# Patient Record
Sex: Female | Born: 1960 | Race: White | Hispanic: No | Marital: Married | Smoking: Current every day smoker
Health system: Southern US, Community
[De-identification: ages and names within clinical notes are randomized; demographics above are authoritative.]

## PROBLEM LIST (undated history)

## (undated) DIAGNOSIS — E785 Hyperlipidemia, unspecified: Secondary | ICD-10-CM

## (undated) DIAGNOSIS — R002 Palpitations: Secondary | ICD-10-CM

## (undated) DIAGNOSIS — I10 Essential (primary) hypertension: Secondary | ICD-10-CM

## (undated) DIAGNOSIS — I340 Nonrheumatic mitral (valve) insufficiency: Secondary | ICD-10-CM

## (undated) DIAGNOSIS — A6000 Herpesviral infection of urogenital system, unspecified: Secondary | ICD-10-CM

## (undated) DIAGNOSIS — Z8659 Personal history of other mental and behavioral disorders: Secondary | ICD-10-CM

## (undated) HISTORY — DX: Personal history of other mental and behavioral disorders: Z86.59

## (undated) HISTORY — DX: Hyperlipidemia, unspecified: E78.5

## (undated) HISTORY — DX: Essential (primary) hypertension: I10

## (undated) HISTORY — DX: Palpitations: R00.2

## (undated) HISTORY — PX: NO PAST SURGERIES: SHX2092

## (undated) HISTORY — DX: Nonrheumatic mitral (valve) insufficiency: I34.0

## (undated) HISTORY — DX: Herpesviral infection of urogenital system, unspecified: A60.00

---

## 1997-06-09 ENCOUNTER — Ambulatory Visit (HOSPITAL_BASED_OUTPATIENT_CLINIC_OR_DEPARTMENT_OTHER): Admission: RE | Admit: 1997-06-09 | Discharge: 1997-06-09 | Payer: Self-pay | Admitting: Otolaryngology

## 1997-10-05 ENCOUNTER — Other Ambulatory Visit: Admission: RE | Admit: 1997-10-05 | Discharge: 1997-10-05 | Payer: Self-pay | Admitting: *Deleted

## 1997-11-28 ENCOUNTER — Ambulatory Visit (HOSPITAL_COMMUNITY): Admission: RE | Admit: 1997-11-28 | Discharge: 1997-11-28 | Payer: Self-pay | Admitting: *Deleted

## 2002-02-15 ENCOUNTER — Encounter: Admission: RE | Admit: 2002-02-15 | Discharge: 2002-02-15 | Payer: Self-pay | Admitting: Family Medicine

## 2002-02-15 ENCOUNTER — Encounter: Payer: Self-pay | Admitting: Family Medicine

## 2002-03-01 ENCOUNTER — Encounter: Admission: RE | Admit: 2002-03-01 | Discharge: 2002-05-30 | Payer: Self-pay | Admitting: Family Medicine

## 2003-02-14 ENCOUNTER — Other Ambulatory Visit: Admission: RE | Admit: 2003-02-14 | Discharge: 2003-02-14 | Payer: Self-pay | Admitting: *Deleted

## 2003-03-15 ENCOUNTER — Encounter: Admission: RE | Admit: 2003-03-15 | Discharge: 2003-03-15 | Payer: Self-pay | Admitting: Family Medicine

## 2003-08-08 ENCOUNTER — Encounter: Admission: RE | Admit: 2003-08-08 | Discharge: 2003-08-08 | Payer: Self-pay | Admitting: Family Medicine

## 2003-10-30 ENCOUNTER — Other Ambulatory Visit: Admission: RE | Admit: 2003-10-30 | Discharge: 2003-10-30 | Payer: Self-pay | Admitting: *Deleted

## 2004-04-16 ENCOUNTER — Encounter: Admission: RE | Admit: 2004-04-16 | Discharge: 2004-04-16 | Payer: Self-pay | Admitting: Family Medicine

## 2004-04-29 ENCOUNTER — Other Ambulatory Visit: Admission: RE | Admit: 2004-04-29 | Discharge: 2004-04-29 | Payer: Self-pay | Admitting: *Deleted

## 2004-10-28 ENCOUNTER — Other Ambulatory Visit: Admission: RE | Admit: 2004-10-28 | Discharge: 2004-10-28 | Payer: Self-pay | Admitting: *Deleted

## 2004-11-01 ENCOUNTER — Encounter: Admission: RE | Admit: 2004-11-01 | Discharge: 2005-01-12 | Payer: Self-pay | Admitting: Family Medicine

## 2005-04-23 ENCOUNTER — Encounter: Admission: RE | Admit: 2005-04-23 | Discharge: 2005-04-23 | Payer: Self-pay | Admitting: Family Medicine

## 2005-06-06 ENCOUNTER — Other Ambulatory Visit: Admission: RE | Admit: 2005-06-06 | Discharge: 2005-06-06 | Payer: Self-pay | Admitting: *Deleted

## 2006-07-08 ENCOUNTER — Other Ambulatory Visit: Admission: RE | Admit: 2006-07-08 | Discharge: 2006-07-08 | Payer: Self-pay | Admitting: *Deleted

## 2006-07-30 ENCOUNTER — Encounter: Admission: RE | Admit: 2006-07-30 | Discharge: 2006-07-30 | Payer: Self-pay | Admitting: Family Medicine

## 2006-08-03 ENCOUNTER — Encounter: Admission: RE | Admit: 2006-08-03 | Discharge: 2006-08-03 | Payer: Self-pay | Admitting: Family Medicine

## 2006-08-04 ENCOUNTER — Encounter: Admission: RE | Admit: 2006-08-04 | Discharge: 2006-08-04 | Payer: Self-pay | Admitting: Family Medicine

## 2007-01-01 ENCOUNTER — Other Ambulatory Visit: Admission: RE | Admit: 2007-01-01 | Discharge: 2007-01-01 | Payer: Self-pay | Admitting: Gynecology

## 2007-08-04 ENCOUNTER — Other Ambulatory Visit: Admission: RE | Admit: 2007-08-04 | Discharge: 2007-08-04 | Payer: Self-pay | Admitting: Family Medicine

## 2010-01-30 ENCOUNTER — Other Ambulatory Visit
Admission: RE | Admit: 2010-01-30 | Discharge: 2010-01-30 | Payer: Self-pay | Source: Home / Self Care | Admitting: Family Medicine

## 2010-01-30 ENCOUNTER — Other Ambulatory Visit: Payer: Self-pay | Admitting: Family Medicine

## 2010-01-31 ENCOUNTER — Encounter
Admission: RE | Admit: 2010-01-31 | Discharge: 2010-01-31 | Payer: Self-pay | Source: Home / Self Care | Attending: Family Medicine | Admitting: Family Medicine

## 2010-02-03 ENCOUNTER — Encounter: Payer: Self-pay | Admitting: Family Medicine

## 2010-02-07 ENCOUNTER — Encounter
Admission: RE | Admit: 2010-02-07 | Discharge: 2010-02-07 | Payer: Self-pay | Source: Home / Self Care | Attending: Family Medicine | Admitting: Family Medicine

## 2012-03-08 ENCOUNTER — Other Ambulatory Visit (HOSPITAL_COMMUNITY)
Admission: RE | Admit: 2012-03-08 | Discharge: 2012-03-08 | Disposition: A | Discharge status: Home / Self Care | Payer: 59 | Source: Ambulatory Visit | Attending: Family Medicine | Admitting: Family Medicine

## 2012-03-08 ENCOUNTER — Other Ambulatory Visit: Payer: Self-pay | Admitting: Family Medicine

## 2012-03-08 DIAGNOSIS — Z124 Encounter for screening for malignant neoplasm of cervix: Secondary | ICD-10-CM | POA: Insufficient documentation

## 2012-03-09 ENCOUNTER — Other Ambulatory Visit: Payer: Self-pay | Admitting: Family Medicine

## 2012-03-09 DIAGNOSIS — Z1231 Encounter for screening mammogram for malignant neoplasm of breast: Secondary | ICD-10-CM

## 2012-04-08 ENCOUNTER — Ambulatory Visit
Admission: RE | Admit: 2012-04-08 | Discharge: 2012-04-08 | Disposition: A | Discharge status: Home / Self Care | Payer: 59 | Source: Ambulatory Visit | Attending: Family Medicine | Admitting: Family Medicine

## 2012-04-08 DIAGNOSIS — Z1231 Encounter for screening mammogram for malignant neoplasm of breast: Secondary | ICD-10-CM

## 2012-12-07 ENCOUNTER — Other Ambulatory Visit: Payer: Self-pay | Admitting: Cardiology

## 2013-03-08 ENCOUNTER — Encounter: Payer: Self-pay | Admitting: General Surgery

## 2013-03-08 DIAGNOSIS — I10 Essential (primary) hypertension: Secondary | ICD-10-CM | POA: Insufficient documentation

## 2013-03-08 DIAGNOSIS — R002 Palpitations: Secondary | ICD-10-CM

## 2013-03-08 DIAGNOSIS — I059 Rheumatic mitral valve disease, unspecified: Secondary | ICD-10-CM | POA: Insufficient documentation

## 2013-03-08 DIAGNOSIS — Z0389 Encounter for observation for other suspected diseases and conditions ruled out: Secondary | ICD-10-CM | POA: Insufficient documentation

## 2013-03-09 ENCOUNTER — Ambulatory Visit: Payer: 59 | Admitting: Cardiology

## 2013-04-11 ENCOUNTER — Other Ambulatory Visit: Payer: Self-pay

## 2013-04-11 DIAGNOSIS — Z1231 Encounter for screening mammogram for malignant neoplasm of breast: Secondary | ICD-10-CM

## 2013-04-12 ENCOUNTER — Encounter: Payer: Self-pay | Admitting: Cardiology

## 2013-04-12 ENCOUNTER — Ambulatory Visit (INDEPENDENT_AMBULATORY_CARE_PROVIDER_SITE_OTHER): Payer: 59 | Admitting: Cardiology

## 2013-04-12 VITALS — BP 132/80 | HR 60 | Ht 64.0 in | Wt 139.0 lb

## 2013-04-12 DIAGNOSIS — I059 Rheumatic mitral valve disease, unspecified: Secondary | ICD-10-CM

## 2013-04-12 DIAGNOSIS — R002 Palpitations: Secondary | ICD-10-CM

## 2013-04-12 DIAGNOSIS — I10 Essential (primary) hypertension: Secondary | ICD-10-CM

## 2013-04-12 NOTE — Progress Notes (Signed)
[address], [address] [place], [place]  [address] Phone: [phone] Fax:  [phone]  Date:  04/12/2013   ID:  Jack QuartoMelissa P Hurlbutt, DOB 06/29/1960, MRN [id number]  PCP:  Rozanna BoxBABAOFF, MARC E, MD  Cardiologist:  Armanda Magicraci Zayleigh Stroh, MD     History of Present Illness: Jack QuartoMelissa P Neyman is a 53 y.o. female with a history of HTN, mild MR and palpitations with no arrhythimas on heart monitor and suppressed on beta blockers who presents today for followup.  She is doing well. She denies any chest pain, SOB, DOE ,LE edema ,dizziness or syncope. She has been through a lot of stress in the past year and is going through a divorce.  She has not really noticed any palpitations.     Wt Readings from Last 3 Encounters:  04/12/13 139 lb (63.05 kg)     Past Medical History  Diagnosis Date  . Heart palpitations   . Mild mitral regurgitation echo 02/08/09    -DR Keyvon Herter  . Dyslipidemia   . Hypertension   . Genital herpes   . History of depression     Current Outpatient Prescriptions  Medication Sig Dispense Refill  . atorvastatin (LIPITOR) 10 MG tablet Take 10 mg by mouth daily.      . carvedilol (COREG) 6.25 MG tablet Take 1 tablet by mouth  twice a day  180 tablet  2  . citalopram (CELEXA) 40 MG tablet Take 40 mg by mouth daily.      Marland Kitchen. lisinopril-hydrochlorothiazide (PRINZIDE,ZESTORETIC) 20-12.5 MG per tablet Take 2 tablets by mouth  daily  180 tablet  2  . loratadine (CLARITIN) 10 MG tablet Take 10 mg by mouth as needed for allergies.      . Multiple Vitamin (MULTIVITAMIN) tablet Take 1 tablet by mouth daily.      . Omega-3 Fatty Acids (FISH OIL) 1000 MG CAPS Take 1 capsule by mouth 2 (two) times daily.      . valACYclovir (VALTREX) 500 MG tablet Take 500 mg by mouth daily.       No current facility-administered medications for this visit.    Allergies:    Allergies  Allergen Reactions  . Atorvastatin     Joint aches   . Erythromycin   . Penicillins     Social History:  The patient   reports that she has been smoking Cigarettes.  She has been smoking about 0.50 packs per day. She does not have any smokeless tobacco history on file. She reports that she drinks alcohol. She reports that she does not use illicit drugs.   Family History:  The patient's family history is not on file.   ROS:  Please see the history of present illness.      All other systems reviewed and negative.   PHYSICAL EXAM: VS:  BP 132/80  Pulse 60  Ht 5\' 4"  (1.626 m)  Wt 139 lb (63.05 kg)  BMI 23.85 kg/m2 Well nourished, well developed, in no acute distress HEENT: normal Neck: no JVD Cardiac:  normal S1, S2; RRR; no murmur Lungs:  clear to auscultation bilaterally, no wheezing, rhonchi or rales Abd: soft, nontender, no hepatomegaly Ext: no edema Skin: warm and dry Neuro:  CNs 2-12 intact, no focal abnormalities noted  EKG:   NSR at 60 bpm with no ST changes  ASSESSMENT AND PLAN:  1. Palpitations - suppressed on beta blockers 2. HTN - well controlled  - continue Lisinopril HCT and Carvedilol 3. Mild MR She has a  new insurance with a high deductible.  I have told her that she has been very stable from a cardiac standpoint and I think it is fine for Dr. Zachery Dauer to follow her HTN and if she has further problems with palpitations she can see me back.  She will followup with me on a PRN basis.  Signed, Armanda Magic, MD 04/12/2013 3:35 PM

## 2013-04-12 NOTE — Patient Instructions (Signed)
Your physician recommends that you continue on your current medications as directed. Please refer to the Current Medication list given to you today.  Follow up as needed  

## 2013-04-20 ENCOUNTER — Ambulatory Visit
Admission: RE | Admit: 2013-04-20 | Discharge: 2013-04-20 | Disposition: A | Discharge status: Home / Self Care | Payer: 59 | Source: Ambulatory Visit

## 2013-04-20 DIAGNOSIS — Z1231 Encounter for screening mammogram for malignant neoplasm of breast: Secondary | ICD-10-CM

## 2013-12-15 ENCOUNTER — Other Ambulatory Visit: Payer: Self-pay | Admitting: Cardiology

## 2014-08-24 ENCOUNTER — Other Ambulatory Visit: Payer: Self-pay | Admitting: Cardiology

## 2014-11-26 ENCOUNTER — Emergency Department (HOSPITAL_BASED_OUTPATIENT_CLINIC_OR_DEPARTMENT_OTHER)
Admission: EM | Admit: 2014-11-26 | Discharge: 2014-11-26 | Disposition: A | Discharge status: Home / Self Care | Payer: 59 | Attending: Emergency Medicine | Admitting: Emergency Medicine

## 2014-11-26 ENCOUNTER — Emergency Department (HOSPITAL_BASED_OUTPATIENT_CLINIC_OR_DEPARTMENT_OTHER): Payer: 59

## 2014-11-26 ENCOUNTER — Encounter (HOSPITAL_BASED_OUTPATIENT_CLINIC_OR_DEPARTMENT_OTHER): Payer: Self-pay | Admitting: Emergency Medicine

## 2014-11-26 DIAGNOSIS — F329 Major depressive disorder, single episode, unspecified: Secondary | ICD-10-CM | POA: Diagnosis not present

## 2014-11-26 DIAGNOSIS — R1032 Left lower quadrant pain: Secondary | ICD-10-CM | POA: Insufficient documentation

## 2014-11-26 DIAGNOSIS — Z8619 Personal history of other infectious and parasitic diseases: Secondary | ICD-10-CM | POA: Insufficient documentation

## 2014-11-26 DIAGNOSIS — R1031 Right lower quadrant pain: Secondary | ICD-10-CM | POA: Diagnosis present

## 2014-11-26 DIAGNOSIS — Z79899 Other long term (current) drug therapy: Secondary | ICD-10-CM | POA: Insufficient documentation

## 2014-11-26 DIAGNOSIS — R1030 Lower abdominal pain, unspecified: Secondary | ICD-10-CM

## 2014-11-26 DIAGNOSIS — I10 Essential (primary) hypertension: Secondary | ICD-10-CM | POA: Diagnosis not present

## 2014-11-26 DIAGNOSIS — E785 Hyperlipidemia, unspecified: Secondary | ICD-10-CM | POA: Insufficient documentation

## 2014-11-26 DIAGNOSIS — R197 Diarrhea, unspecified: Secondary | ICD-10-CM

## 2014-11-26 DIAGNOSIS — Z880 Allergy status to penicillin: Secondary | ICD-10-CM | POA: Insufficient documentation

## 2014-11-26 DIAGNOSIS — F17210 Nicotine dependence, cigarettes, uncomplicated: Secondary | ICD-10-CM | POA: Diagnosis not present

## 2014-11-26 LAB — URINALYSIS, ROUTINE W REFLEX MICROSCOPIC
Bilirubin Urine: NEGATIVE
Glucose, UA: NEGATIVE mg/dL
Hgb urine dipstick: NEGATIVE
Ketones, ur: NEGATIVE mg/dL
Leukocytes, UA: NEGATIVE
Nitrite: NEGATIVE
Protein, ur: NEGATIVE mg/dL
Specific Gravity, Urine: 1.016 (ref 1.005–1.030)
Urobilinogen, UA: 0.2 mg/dL (ref 0.0–1.0)
pH: 5.5 (ref 5.0–8.0)

## 2014-11-26 LAB — CBC WITH DIFFERENTIAL/PLATELET
Basophils Absolute: 0 10*3/uL (ref 0.0–0.1)
Basophils Relative: 0 %
Eosinophils Absolute: 0.1 10*3/uL (ref 0.0–0.7)
Eosinophils Relative: 1 %
HCT: 42.3 % (ref 36.0–46.0)
Hemoglobin: 14.5 g/dL (ref 12.0–15.0)
Lymphocytes Relative: 18 %
Lymphs Abs: 2.1 10*3/uL (ref 0.7–4.0)
MCH: 35 pg — ABNORMAL HIGH (ref 26.0–34.0)
MCHC: 34.3 g/dL (ref 30.0–36.0)
MCV: 102.2 fL — ABNORMAL HIGH (ref 78.0–100.0)
Monocytes Absolute: 1 10*3/uL (ref 0.1–1.0)
Monocytes Relative: 8 %
Neutro Abs: 8.4 10*3/uL — ABNORMAL HIGH (ref 1.7–7.7)
Neutrophils Relative %: 73 %
Platelets: 287 10*3/uL (ref 150–400)
RBC: 4.14 MIL/uL (ref 3.87–5.11)
RDW: 11.5 % (ref 11.5–15.5)
WBC: 11.7 10*3/uL — ABNORMAL HIGH (ref 4.0–10.5)

## 2014-11-26 LAB — COMPREHENSIVE METABOLIC PANEL
ALT: 50 U/L (ref 14–54)
AST: 37 U/L (ref 15–41)
Albumin: 4.9 g/dL (ref 3.5–5.0)
Alkaline Phosphatase: 76 U/L (ref 38–126)
Anion gap: 11 (ref 5–15)
BUN: 13 mg/dL (ref 6–20)
CO2: 28 mmol/L (ref 22–32)
Calcium: 10.3 mg/dL (ref 8.9–10.3)
Chloride: 98 mmol/L — ABNORMAL LOW (ref 101–111)
Creatinine, Ser: 0.59 mg/dL (ref 0.44–1.00)
GFR calc Af Amer: >60 mL/min (ref >60)
GFR calc non Af Amer: >60 mL/min (ref >60)
Glucose, Bld: 106 mg/dL — ABNORMAL HIGH (ref 65–99)
Potassium: 3.5 mmol/L (ref 3.5–5.1)
Sodium: 137 mmol/L (ref 135–145)
Total Bilirubin: 0.9 mg/dL (ref 0.3–1.2)
Total Protein: 8.6 g/dL — ABNORMAL HIGH (ref 6.5–8.1)

## 2014-11-26 LAB — WET PREP, GENITAL
Clue Cells Wet Prep HPF POC: NONE SEEN
Trich, Wet Prep: NONE SEEN
Yeast Wet Prep HPF POC: NONE SEEN

## 2014-11-26 LAB — LIPASE, BLOOD: Lipase: 34 U/L (ref 11–51)

## 2014-11-26 MED ORDER — DICYCLOMINE HCL 20 MG PO TABS
20.0000 mg | ORAL_TABLET | Freq: Three times a day (TID) | ORAL | Status: AC | PRN
Start: 2014-11-26 — End: ?

## 2014-11-26 MED ORDER — SODIUM CHLORIDE 0.9 % IV BOLUS (SEPSIS)
1000.0000 mL | Freq: Once | INTRAVENOUS | Status: AC
  Administered 2014-11-26: 1000 mL via INTRAVENOUS

## 2014-11-26 MED ORDER — IOHEXOL 300 MG/ML  SOLN
100.0000 mL | Freq: Once | INTRAMUSCULAR | Status: AC | PRN
  Administered 2014-11-26: 100 mL via INTRAVENOUS

## 2014-11-26 MED ORDER — IOHEXOL 300 MG/ML  SOLN
25.0000 mL | Freq: Once | INTRAMUSCULAR | Status: AC | PRN
  Administered 2014-11-26: 25 mL via ORAL

## 2014-11-26 NOTE — ED Notes (Signed)
Pt in c/o sharp stabbing lower abdominal pain associated with diarrhea but no N/V, no vaginal bleeding or discharge.

## 2014-11-26 NOTE — ED Provider Notes (Signed)
CSN: [id number]     Arrival date & time 11/26/14  1620 History   First MD Initiated Contact with Patient 11/26/14 1629     Chief Complaint  Patient presents with  . Abdominal Pain     (Consider location/radiation/quality/duration/timing/severity/associated sxs/prior Treatment) HPI  Blood pressure 190/91, pulse 79, temperature 98.4 F (36.9 C), temperature source Oral, resp. rate 18, height  (1.626 m), weight 144 lb (65.318 kg), SpO2 98 %.  Yvonne Pacheco is a 54 y.o. female complaining of intermittent colicky lower abdominal pain onset 10 days ago, returning to days ago. States it is sharp and stabbing, bilateral and associated with diarrhea. Patient describes the diarrhea as watery she denies melena, hematochezia. She had 4 episodes yesterday. No antibiotic use in the last 2 months. She denies fever, chills, nausea, vomiting, chest pain, shortness of breath, dysuria, hematuria, urinary frequency, abnormal vaginal discharge. No pain medication taken prior to arrival. Last menses 8 years ago  Past Medical History  Diagnosis Date  . Heart palpitations   . Mild mitral regurgitation echo 02/08/09    -DR TURNER  . Dyslipidemia   . Hypertension   . Genital herpes   . History of depression    History reviewed. No pertinent past surgical history. History reviewed. No pertinent family history. Social History  Substance Use Topics  . Smoking status: Current Every Day Smoker -- 0.50 packs/day    Types: Cigarettes  . Smokeless tobacco: None  . Alcohol Use: Yes     Comment: 6-12 drinks weekly   OB History    No data available     Review of Systems  10 systems reviewed and found to be negative, except as noted in the HPI.   Allergies  Erythromycin and Penicillins  Home Medications   Prior to Admission medications   Medication Sig Start Date End Date Taking? Authorizing Provider  atorvastatin (LIPITOR) 10 MG tablet Take 10 mg by mouth daily.    Historical Provider, MD   carvedilol (COREG) 6.25 MG tablet Take 1 tablet by mouth  twice a day 08/24/14   Quintella Reichert, MD  citalopram (CELEXA) 40 MG tablet Take 40 mg by mouth daily.    Historical Provider, MD  dicyclomine (BENTYL) 20 MG tablet Take 1 tablet (20 mg total) by mouth 3 (three) times daily as needed for spasms. 11/26/14   Rudra Hobbins, PA-C  lisinopril-hydrochlorothiazide (PRINZIDE,ZESTORETIC) 20-12.5 MG per tablet Take 2 tablets by mouth  daily 08/24/14   Quintella Reichert, MD  loratadine (CLARITIN) 10 MG tablet Take 10 mg by mouth as needed for allergies.    Historical Provider, MD  Multiple Vitamin (MULTIVITAMIN) tablet Take 1 tablet by mouth daily.    Historical Provider, MD  Omega-3 Fatty Acids (FISH OIL) 1000 MG CAPS Take 1 capsule by mouth 2 (two) times daily.    Historical Provider, MD  valACYclovir (VALTREX) 500 MG tablet Take 500 mg by mouth daily.    Historical Provider, MD   BP 172/83 mmHg  Pulse 67  Temp(Src) 98.4 F (36.9 C) (Oral)  Resp 18  Ht  (1.626 m)  Wt 144 lb (65.318 kg)  BMI 24.71 kg/m2  SpO2 96% Physical Exam  Constitutional: She is oriented to person, place, and time. She appears well-developed and well-nourished. No distress.  HENT:  Head: Normocephalic and atraumatic.  Mouth/Throat: Oropharynx is clear and moist.  Eyes: Conjunctivae and EOM are normal. Pupils are equal, round, and reactive to light.  Neck: Normal range of  motion.  Cardiovascular: Normal rate, regular rhythm and intact distal pulses.   Pulmonary/Chest: Effort normal and breath sounds normal. No stridor.  Abdominal: Soft. Bowel sounds are normal. She exhibits no distension and no mass. There is tenderness. There is no rebound and no guarding.  Mild tenderness palpation the right lower quadrant, Rovsing, so as an obturator are negative.  Pelvic exam a chaperoned by technician Arline Asp: No rashes or lesions, no abnormal vaginal discharge mild left adnexal tenderness with no cervical motion or right  adnexal tenderness.  Musculoskeletal: Normal range of motion.  Neurological: She is alert and oriented to person, place, and time.  Psychiatric: She has a normal mood and affect.  Nursing note and vitals reviewed.   ED Course  Procedures (including critical care time) Labs Review Labs Reviewed  WET PREP, GENITAL - Abnormal; Notable for the following:    WBC, Wet Prep HPF POC FEW (*)    All other components within normal limits  URINALYSIS, ROUTINE W REFLEX MICROSCOPIC (NOT AT [facility]) - Abnormal; Notable for the following:    APPearance CLOUDY (*)    All other components within normal limits  CBC WITH DIFFERENTIAL/PLATELET - Abnormal; Notable for the following:    WBC 11.7 (*)    MCV 102.2 (*)    MCH 35.0 (*)    Neutro Abs 8.4 (*)    All other components within normal limits  COMPREHENSIVE METABOLIC PANEL - Abnormal; Notable for the following:    Chloride 98 (*)    Glucose, Bld 106 (*)    Total Protein 8.6 (*)    All other components within normal limits  LIPASE, BLOOD  GI PATHOGEN PANEL BY PCR, STOOL  GC/CHLAMYDIA PROBE AMP (Ray) NOT AT [facility]    Imaging Review Ct Abdomen Pelvis W Contrast  11/26/2014  CLINICAL DATA:  Sharp stabbing lower abdominal pain and diarrhea. EXAM: CT ABDOMEN AND PELVIS WITH CONTRAST TECHNIQUE: Multidetector CT imaging of the abdomen and pelvis was performed using the standard protocol following bolus administration of intravenous contrast. CONTRAST:  25mL OMNIPAQUE IOHEXOL 300 MG/ML SOLN, OMNIPAQUE IOHEXOL 300 MG/ML SOLN COMPARISON:  None. FINDINGS: Lung bases are normal. Abdominal images demonstrate a normal liver, spleen, pancreas, gallbladder, adrenal glands and stomach. Kidneys are normal in size without hydronephrosis or nephrolithiasis. Ureters are normal. Minimal calcified plaque at the aortic bifurcation. The colon is within normal. Appendix is air-filled and loops back on itself and is otherwise within normal. No adjacent inflammation  or fluid. Small bowel is within normal. Pelvic images demonstrate a normal bladder and rectum. 1.5 cm fibroid over the uterine fundus. Ovaries are within normal. No free pelvic fluid. Remaining bones and soft tissues are within normal. IMPRESSION: No acute findings in the abdomen/ pelvis. Small uterine fibroid. Electronically Signed   By: Elberta Fortis M.D.   On: 11/26/2014 18:47   I have personally reviewed and evaluated these images and lab results as part of my medical decision-making.   EKG Interpretation None      MDM   Final diagnoses:  Lower abdominal pain  Diarrhea, unspecified type    Filed Vitals:   11/26/14 1626 11/26/14 1859 11/26/14 1900  BP: 190/91 172/83 172/83  Pulse: 79 66 67  Temp: 98.4 F (36.9 C)    TempSrc: Oral    Resp: 18 18   Height:  (1.626 m)    Weight: 144 lb (65.318 kg)    SpO2: 98% 97% 96%    Medications  sodium chloride 0.9 %  bolus 1,000 mL (0 mLs Intravenous Stopped 11/26/14 1933)  iohexol (OMNIPAQUE) 300 MG/ML solution 25 mL (25 mLs Oral Contrast Given 11/26/14 1705)  iohexol (OMNIPAQUE) 300 MG/ML solution 100 mL (100 mLs Intravenous Contrast Given 11/26/14 1831)    Yvonne Pacheco is 54 y.o. female presenting with crampy bilateral lower abdominal pain associated with diarrhea. Patient declines pain medication. Abdominal exam is nonsurgical.  Blood work with a mild leukocytosis, pelvic exam unremarkable. Urinalysis without signs of infection. CT abdomen pelvis negative. Likely just a viral enteritis. Patient given Bentyl, encouraged to follow closely with primary care.  Evaluation does not show pathology that would require ongoing emergent intervention or inpatient treatment. Pt is hemodynamically stable and mentating appropriately. Discussed findings and plan with patient/guardian, who agrees with care plan. All questions answered. Return precautions discussed and outpatient follow up given.   Discharge Medication List as of 11/26/2014   7:26 PM    START taking these medications   Details  dicyclomine (BENTYL) 20 MG tablet Take 1 tablet (20 mg total) by mouth 3 (three) times daily as needed for spasms., Starting 11/26/2014, Until Discontinued, Print            Wynetta Emeryicole Keilana Morlock, PA-C 11/26/14 2019  Melene Planan Floyd, DO 11/26/14 [id number]

## 2014-11-26 NOTE — Discharge Instructions (Signed)
Take acetaminophen (Tylenol) up to 975 mg (this is normally 3 over-the-counter pills) up to 3 times a day. Do not drink alcohol. Make sure your other medications do not contain acetaminophen (Read the labels!)  Please follow with your primary care doctor in the next 2 days for a check-up. They must obtain records for further management.   Do not hesitate to return to the Emergency Department for any new, worsening or concerning symptoms.    Diarrhea Diarrhea is frequent loose and watery bowel movements. It can cause you to feel weak and dehydrated. Dehydration can cause you to become tired and thirsty, have a dry mouth, and have decreased urination that often is dark yellow. Diarrhea is a sign of another problem, most often an infection that will not last long. In most cases, diarrhea typically lasts 2-3 days. However, it can last longer if it is a sign of something more serious. It is important to treat your diarrhea as directed by your caregiver to lessen or prevent future episodes of diarrhea. CAUSES  Some common causes include:  Gastrointestinal infections caused by viruses, bacteria, or parasites.  Food poisoning or food allergies.  Certain medicines, such as antibiotics, chemotherapy, and laxatives.  Artificial sweeteners and fructose.  Digestive disorders. HOME CARE INSTRUCTIONS  Ensure adequate fluid intake (hydration): Have 1 cup (8 oz) of fluid for each diarrhea episode. Avoid fluids that contain simple sugars or sports drinks, fruit juices, whole milk products, and sodas. Your urine should be clear or pale yellow if you are drinking enough fluids. Hydrate with an oral rehydration solution that you can purchase at pharmacies, retail stores, and online. You can prepare an oral rehydration solution at home by mixing the following ingredients together:   - tsp table salt.   tsp baking soda.   tsp salt substitute containing potassium chloride.  1  tablespoons sugar.  1 L (34  oz) of water.  Certain foods and beverages may increase the speed at which food moves through the gastrointestinal (GI) tract. These foods and beverages should be avoided and include:  Caffeinated and alcoholic beverages.  High-fiber foods, such as raw fruits and vegetables, nuts, seeds, and whole grain breads and cereals.  Foods and beverages sweetened with sugar alcohols, such as xylitol, sorbitol, and mannitol.  Some foods may be well tolerated and may help thicken stool including:  Starchy foods, such as rice, toast, pasta, low-sugar cereal, oatmeal, grits, baked potatoes, crackers, and bagels.  Bananas.  Applesauce.  Add probiotic-rich foods to help increase healthy bacteria in the GI tract, such as yogurt and fermented milk products.  Wash your hands well after each diarrhea episode.  Only take over-the-counter or prescription medicines as directed by your caregiver.  Take a warm bath to relieve any burning or pain from frequent diarrhea episodes. SEEK IMMEDIATE MEDICAL CARE IF:   You are unable to keep fluids down.  You have persistent vomiting.  You have blood in your stool, or your stools are black and tarry.  You do not urinate in 6-8 hours, or there is only a small amount of very dark urine.  You have abdominal pain that increases or localizes.  You have weakness, dizziness, confusion, or light-headedness.  You have a severe headache.  Your diarrhea gets worse or does not get better.  You have a fever or persistent symptoms for more than 2-3 days.  You have a fever and your symptoms suddenly get worse. MAKE SURE YOU:   Understand these instructions.  Will  watch your condition.  Will get help right away if you are not doing well or get worse.   This information is not intended to replace advice given to you by your health care provider. Make sure you discuss any questions you have with your health care provider.   Document Released: 12/20/2001  Document Revised: 01/20/2014 Document Reviewed: 09/07/2011 Elsevier Interactive Patient Education [employer] Inc.

## 2014-11-27 LAB — GC/CHLAMYDIA PROBE AMP (~~LOC~~) NOT AT ARMC
Chlamydia: NEGATIVE
Neisseria Gonorrhea: NEGATIVE

## 2015-01-22 ENCOUNTER — Other Ambulatory Visit: Payer: Self-pay | Admitting: Cardiology

## 2015-01-22 NOTE — Telephone Encounter (Signed)
Dr. Zachery DauerBarnes prescribes BP meds

## 2015-01-22 NOTE — Telephone Encounter (Signed)
Please advise on refill requests as patient is prn follow up. Thanks, MI

## 2015-05-18 ENCOUNTER — Other Ambulatory Visit: Payer: Self-pay | Admitting: Cardiology

## 2015-05-18 NOTE — Telephone Encounter (Signed)
Patient has not been seen since 2015, but is prn follow up. Should this be deferred to patients pcp? Please advise. Thanks, MI

## 2015-05-21 NOTE — Telephone Encounter (Signed)
Forward to PCP.

## 2015-08-09 ENCOUNTER — Other Ambulatory Visit (HOSPITAL_COMMUNITY)
Admission: RE | Admit: 2015-08-09 | Discharge: 2015-08-09 | Disposition: A | Discharge status: Home / Self Care | Payer: Managed Care, Other (non HMO) | Source: Ambulatory Visit | Attending: Family Medicine | Admitting: Family Medicine

## 2015-08-09 ENCOUNTER — Other Ambulatory Visit: Payer: Self-pay | Admitting: Family Medicine

## 2015-08-09 DIAGNOSIS — Z124 Encounter for screening for malignant neoplasm of cervix: Secondary | ICD-10-CM | POA: Insufficient documentation

## 2015-08-13 LAB — CYTOLOGY - PAP

## 2017-08-20 DIAGNOSIS — E559 Vitamin D deficiency, unspecified: Secondary | ICD-10-CM | POA: Diagnosis not present

## 2017-08-20 DIAGNOSIS — E782 Mixed hyperlipidemia: Secondary | ICD-10-CM | POA: Diagnosis not present

## 2017-08-20 DIAGNOSIS — R740 Nonspecific elevation of levels of transaminase and lactic acid dehydrogenase [LDH]: Secondary | ICD-10-CM | POA: Diagnosis not present

## 2017-08-20 DIAGNOSIS — Z0000 Encounter for general adult medical examination without abnormal findings: Secondary | ICD-10-CM | POA: Diagnosis not present

## 2017-08-20 DIAGNOSIS — I10 Essential (primary) hypertension: Secondary | ICD-10-CM | POA: Diagnosis not present

## 2017-08-21 ENCOUNTER — Other Ambulatory Visit: Payer: Self-pay | Admitting: Family Medicine

## 2017-08-21 DIAGNOSIS — N6459 Other signs and symptoms in breast: Secondary | ICD-10-CM

## 2017-10-23 DIAGNOSIS — D125 Benign neoplasm of sigmoid colon: Secondary | ICD-10-CM | POA: Diagnosis not present

## 2017-10-23 DIAGNOSIS — Z1211 Encounter for screening for malignant neoplasm of colon: Secondary | ICD-10-CM | POA: Diagnosis not present

## 2017-10-23 DIAGNOSIS — D123 Benign neoplasm of transverse colon: Secondary | ICD-10-CM | POA: Diagnosis not present

## 2017-10-23 DIAGNOSIS — Z8371 Family history of colonic polyps: Secondary | ICD-10-CM | POA: Diagnosis not present

## 2018-06-14 DIAGNOSIS — M7062 Trochanteric bursitis, left hip: Secondary | ICD-10-CM | POA: Diagnosis not present

## 2018-07-13 DIAGNOSIS — M7062 Trochanteric bursitis, left hip: Secondary | ICD-10-CM | POA: Diagnosis not present

## 2018-07-20 DIAGNOSIS — M25552 Pain in left hip: Secondary | ICD-10-CM | POA: Diagnosis not present

## 2018-07-20 DIAGNOSIS — M7062 Trochanteric bursitis, left hip: Secondary | ICD-10-CM | POA: Diagnosis not present

## 2018-08-09 DIAGNOSIS — M25552 Pain in left hip: Secondary | ICD-10-CM | POA: Diagnosis not present

## 2018-08-09 DIAGNOSIS — M7062 Trochanteric bursitis, left hip: Secondary | ICD-10-CM | POA: Diagnosis not present

## 2018-10-08 DIAGNOSIS — F339 Major depressive disorder, recurrent, unspecified: Secondary | ICD-10-CM | POA: Diagnosis not present

## 2018-10-08 DIAGNOSIS — E782 Mixed hyperlipidemia: Secondary | ICD-10-CM | POA: Diagnosis not present

## 2018-10-08 DIAGNOSIS — F419 Anxiety disorder, unspecified: Secondary | ICD-10-CM | POA: Diagnosis not present

## 2018-10-08 DIAGNOSIS — I10 Essential (primary) hypertension: Secondary | ICD-10-CM | POA: Diagnosis not present

## 2018-10-21 DIAGNOSIS — Z23 Encounter for immunization: Secondary | ICD-10-CM | POA: Diagnosis not present

## 2019-02-03 DIAGNOSIS — Z20828 Contact with and (suspected) exposure to other viral communicable diseases: Secondary | ICD-10-CM | POA: Diagnosis not present

## 2019-02-04 DIAGNOSIS — Z20828 Contact with and (suspected) exposure to other viral communicable diseases: Secondary | ICD-10-CM | POA: Diagnosis not present

## 2019-02-05 DIAGNOSIS — Z20822 Contact with and (suspected) exposure to covid-19: Secondary | ICD-10-CM | POA: Diagnosis not present

## 2019-02-05 DIAGNOSIS — I10 Essential (primary) hypertension: Secondary | ICD-10-CM | POA: Diagnosis not present

## 2019-02-14 DIAGNOSIS — Z720 Tobacco use: Secondary | ICD-10-CM | POA: Diagnosis not present

## 2019-03-11 DIAGNOSIS — E782 Mixed hyperlipidemia: Secondary | ICD-10-CM | POA: Diagnosis not present

## 2019-03-11 DIAGNOSIS — D7589 Other specified diseases of blood and blood-forming organs: Secondary | ICD-10-CM | POA: Diagnosis not present

## 2019-06-02 ENCOUNTER — Other Ambulatory Visit: Payer: Self-pay | Admitting: Family Medicine

## 2019-06-02 DIAGNOSIS — Z1231 Encounter for screening mammogram for malignant neoplasm of breast: Secondary | ICD-10-CM

## 2019-06-21 ENCOUNTER — Ambulatory Visit
Admission: RE | Admit: 2019-06-21 | Discharge: 2019-06-21 | Disposition: A | Discharge status: Home / Self Care | Payer: BC Managed Care – PPO | Source: Ambulatory Visit | Attending: Family Medicine | Admitting: Family Medicine

## 2019-06-21 ENCOUNTER — Other Ambulatory Visit: Payer: Self-pay

## 2019-06-21 DIAGNOSIS — Z1231 Encounter for screening mammogram for malignant neoplasm of breast: Secondary | ICD-10-CM | POA: Diagnosis not present

## 2019-08-16 DIAGNOSIS — D7589 Other specified diseases of blood and blood-forming organs: Secondary | ICD-10-CM | POA: Diagnosis not present

## 2019-08-16 DIAGNOSIS — I10 Essential (primary) hypertension: Secondary | ICD-10-CM | POA: Diagnosis not present

## 2019-08-16 DIAGNOSIS — F419 Anxiety disorder, unspecified: Secondary | ICD-10-CM | POA: Diagnosis not present

## 2019-08-16 DIAGNOSIS — F339 Major depressive disorder, recurrent, unspecified: Secondary | ICD-10-CM | POA: Diagnosis not present

## 2019-08-16 DIAGNOSIS — E782 Mixed hyperlipidemia: Secondary | ICD-10-CM | POA: Diagnosis not present

## 2019-11-22 DIAGNOSIS — Z23 Encounter for immunization: Secondary | ICD-10-CM | POA: Diagnosis not present

## 2020-01-26 DIAGNOSIS — E782 Mixed hyperlipidemia: Secondary | ICD-10-CM | POA: Diagnosis not present

## 2020-01-26 DIAGNOSIS — I10 Essential (primary) hypertension: Secondary | ICD-10-CM | POA: Diagnosis not present

## 2020-01-26 DIAGNOSIS — F339 Major depressive disorder, recurrent, unspecified: Secondary | ICD-10-CM | POA: Diagnosis not present

## 2020-01-26 DIAGNOSIS — M25562 Pain in left knee: Secondary | ICD-10-CM | POA: Diagnosis not present

## 2020-01-28 DIAGNOSIS — M25562 Pain in left knee: Secondary | ICD-10-CM | POA: Diagnosis not present

## 2020-02-07 DIAGNOSIS — S82192D Other fracture of upper end of left tibia, subsequent encounter for closed fracture with routine healing: Secondary | ICD-10-CM | POA: Diagnosis not present

## 2020-02-15 DIAGNOSIS — R262 Difficulty in walking, not elsewhere classified: Secondary | ICD-10-CM | POA: Diagnosis not present

## 2020-02-15 DIAGNOSIS — M25662 Stiffness of left knee, not elsewhere classified: Secondary | ICD-10-CM | POA: Diagnosis not present

## 2020-02-21 DIAGNOSIS — S82192D Other fracture of upper end of left tibia, subsequent encounter for closed fracture with routine healing: Secondary | ICD-10-CM | POA: Diagnosis not present

## 2020-03-27 DIAGNOSIS — M25562 Pain in left knee: Secondary | ICD-10-CM | POA: Diagnosis not present

## 2020-05-01 DIAGNOSIS — M25562 Pain in left knee: Secondary | ICD-10-CM | POA: Diagnosis not present

## 2020-05-01 DIAGNOSIS — S82192D Other fracture of upper end of left tibia, subsequent encounter for closed fracture with routine healing: Secondary | ICD-10-CM | POA: Diagnosis not present

## 2020-08-14 DIAGNOSIS — U071 COVID-19: Secondary | ICD-10-CM | POA: Diagnosis not present

## 2020-08-27 ENCOUNTER — Other Ambulatory Visit: Payer: Self-pay | Admitting: Family Medicine

## 2020-08-27 DIAGNOSIS — Z1231 Encounter for screening mammogram for malignant neoplasm of breast: Secondary | ICD-10-CM

## 2020-08-30 ENCOUNTER — Other Ambulatory Visit: Payer: Self-pay

## 2020-08-30 ENCOUNTER — Ambulatory Visit
Admission: RE | Admit: 2020-08-30 | Discharge: 2020-08-30 | Disposition: A | Discharge status: Home / Self Care | Payer: BC Managed Care – PPO | Source: Ambulatory Visit | Attending: Family Medicine | Admitting: Family Medicine

## 2020-08-30 DIAGNOSIS — Z1231 Encounter for screening mammogram for malignant neoplasm of breast: Secondary | ICD-10-CM | POA: Diagnosis not present

## 2021-10-07 DIAGNOSIS — S80861A Insect bite (nonvenomous), right lower leg, initial encounter: Secondary | ICD-10-CM | POA: Diagnosis not present

## 2021-10-07 DIAGNOSIS — W57XXXA Bitten or stung by nonvenomous insect and other nonvenomous arthropods, initial encounter: Secondary | ICD-10-CM | POA: Diagnosis not present

## 2021-12-09 DIAGNOSIS — L729 Follicular cyst of the skin and subcutaneous tissue, unspecified: Secondary | ICD-10-CM | POA: Diagnosis not present

## 2022-02-09 IMAGING — MG MM DIGITAL SCREENING BILAT W/ TOMO AND CAD
8 series · 8 of 24 positions shown · non-contrast
Comparison: Previous exam(s).

CLINICAL DATA: Screening.

EXAM:
DIGITAL SCREENING BILATERAL MAMMOGRAM WITH TOMOSYNTHESIS AND CAD
TECHNIQUE: Bilateral screening digital craniocaudal and mediolateral oblique
mammograms were obtained. Bilateral screening digital breast
tomosynthesis was performed. The images were evaluated with
computer-aided detection.

[R MLO synth-2D]
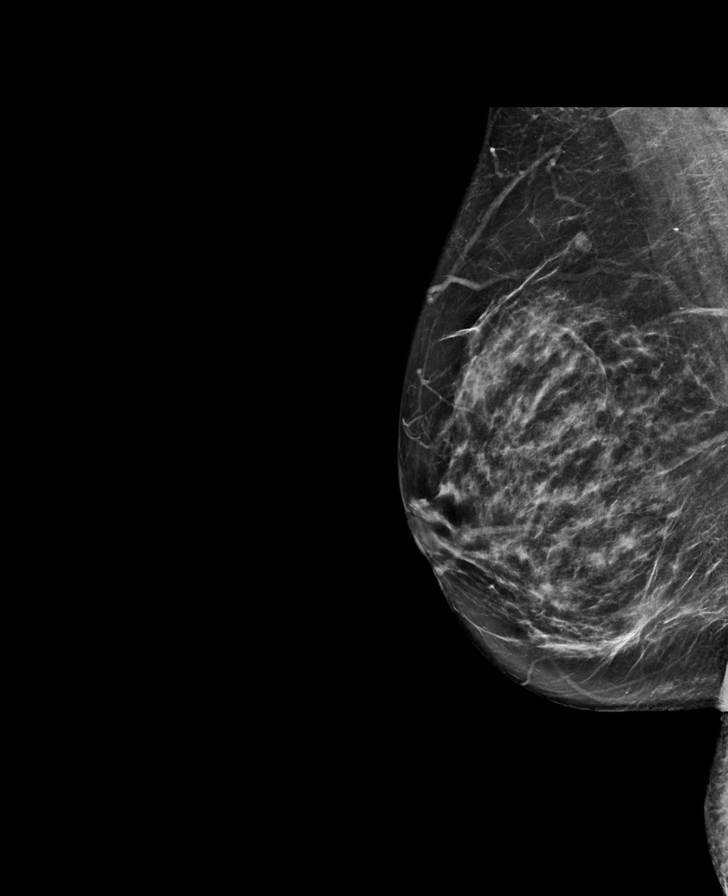

[R CC synth-2D]
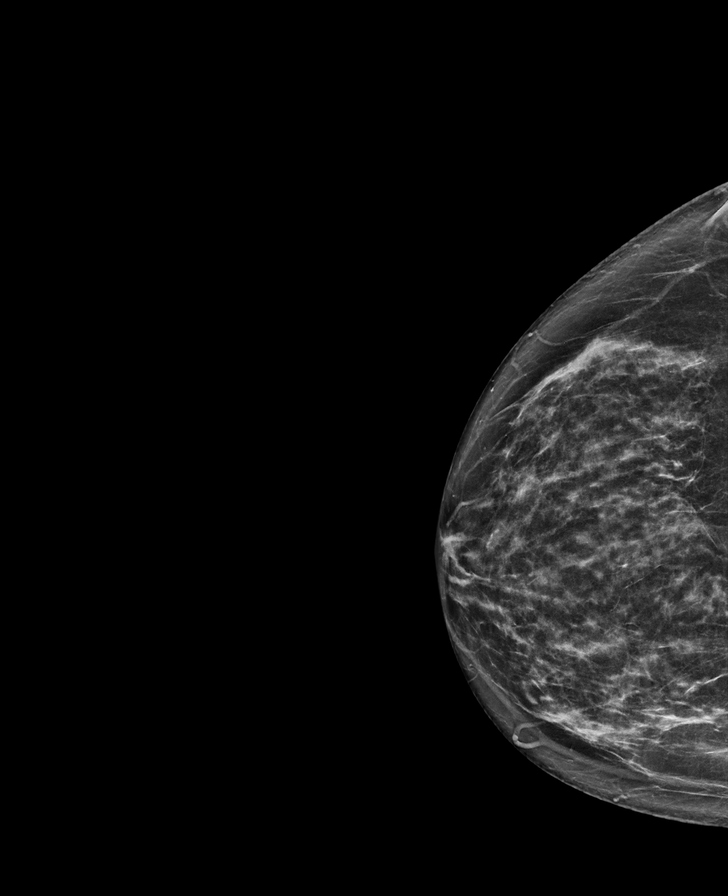

[L CC synth-2D]
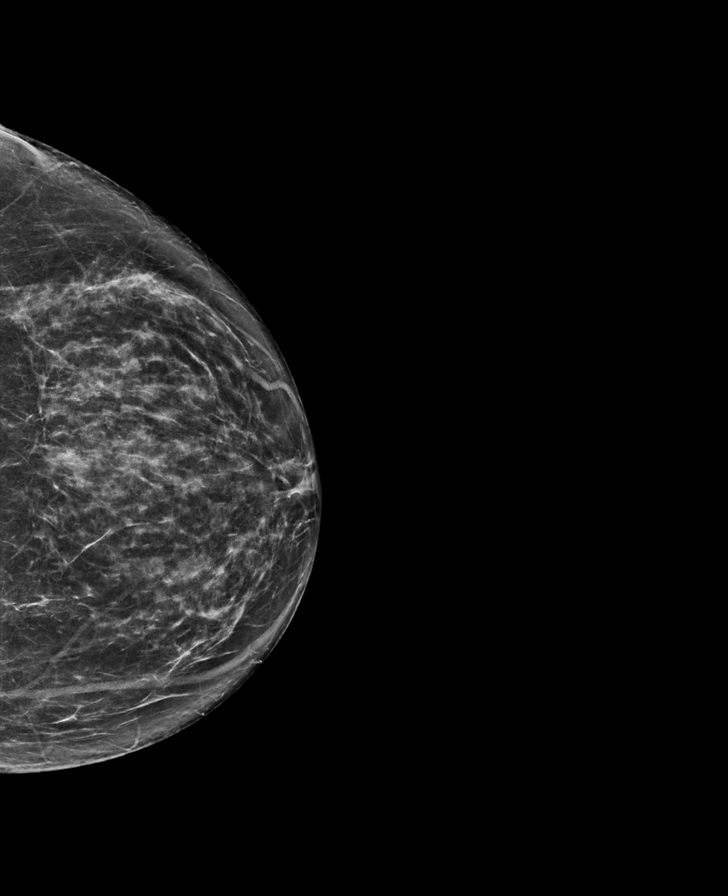

[L MLO synth-2D]
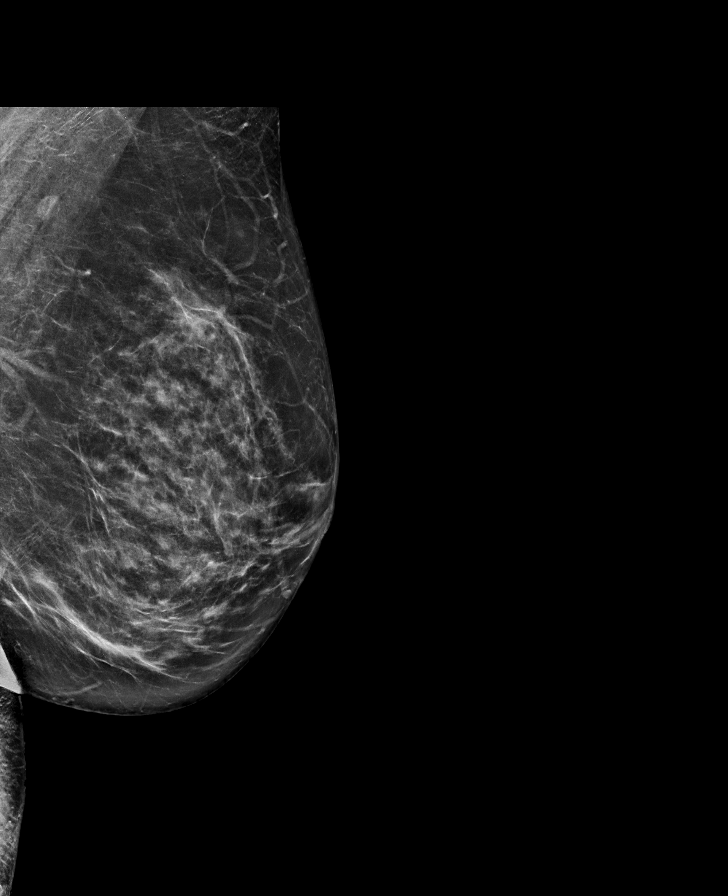

[R MLO tomo · tomo slice 37/74.0]
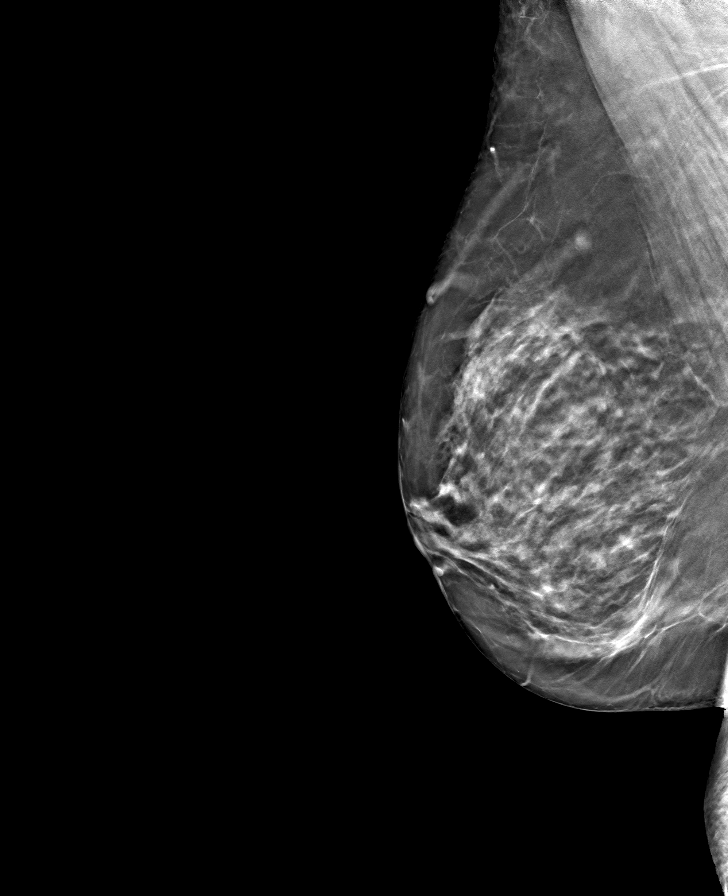

[L CC tomo · tomo slice 34/67.0]
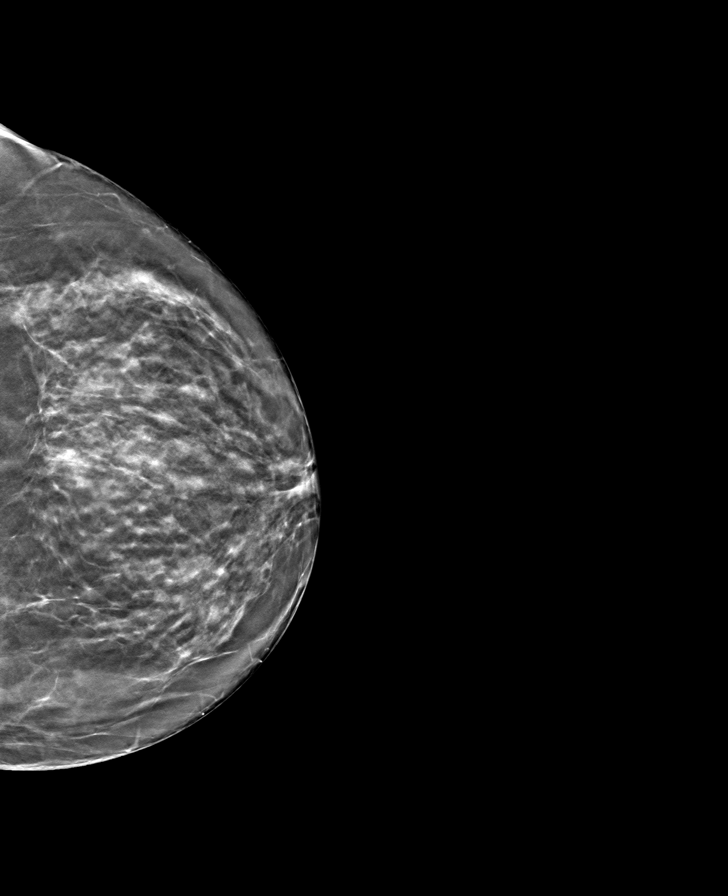

[R CC tomo · tomo slice 35/68.0]
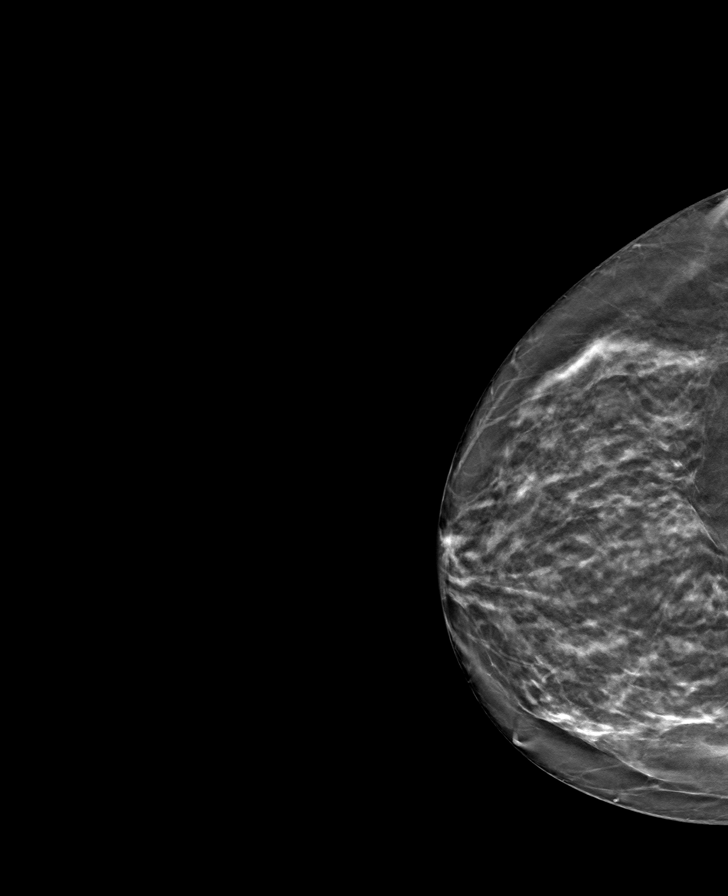

[L MLO tomo · tomo slice 39/77.0]
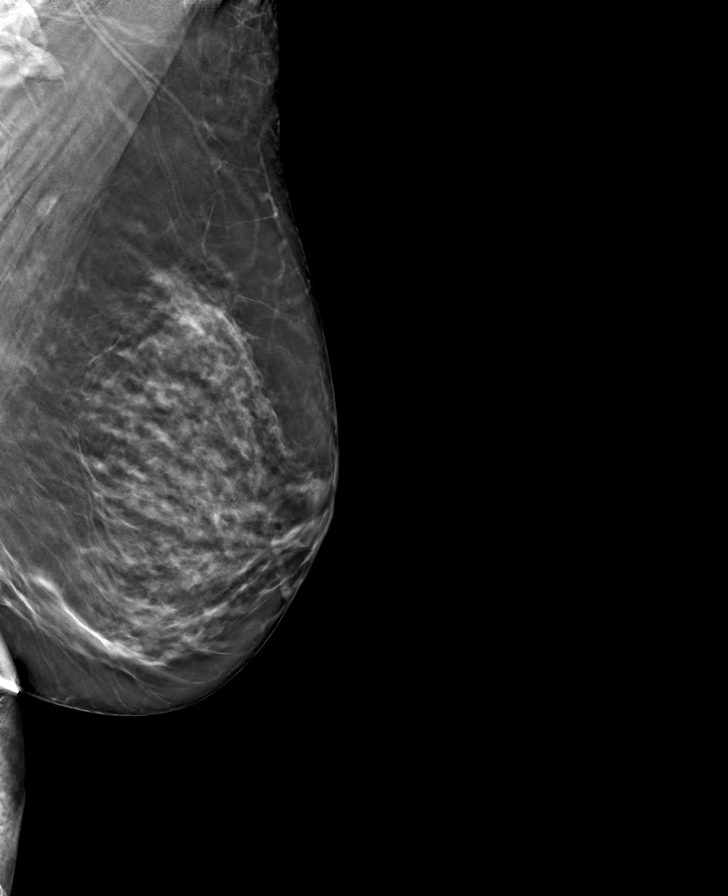

[8 of 24 positions shown; findings below may reference images not displayed]

ACR Breast Density Category c: The breast tissue is heterogeneously
dense, which may obscure small masses.
FINDINGS: There are no findings suspicious for malignancy.
IMPRESSION: No mammographic evidence of malignancy. A result letter of this
screening mammogram will be mailed directly to the patient.

RECOMMENDATION:
Screening mammogram in one year. (Code:Q3-W-BC3)

BI-RADS CATEGORY  1: Negative.

## 2022-05-20 DIAGNOSIS — D7589 Other specified diseases of blood and blood-forming organs: Secondary | ICD-10-CM | POA: Diagnosis not present

## 2022-05-20 DIAGNOSIS — F339 Major depressive disorder, recurrent, unspecified: Secondary | ICD-10-CM | POA: Diagnosis not present

## 2022-05-20 DIAGNOSIS — Z6825 Body mass index (BMI) 25.0-25.9, adult: Secondary | ICD-10-CM | POA: Diagnosis not present

## 2022-05-20 DIAGNOSIS — E782 Mixed hyperlipidemia: Secondary | ICD-10-CM | POA: Diagnosis not present

## 2022-05-20 DIAGNOSIS — I10 Essential (primary) hypertension: Secondary | ICD-10-CM | POA: Diagnosis not present

## 2022-05-22 ENCOUNTER — Other Ambulatory Visit: Payer: Self-pay | Admitting: Family Medicine

## 2022-05-22 DIAGNOSIS — E2839 Other primary ovarian failure: Secondary | ICD-10-CM

## 2022-06-20 ENCOUNTER — Other Ambulatory Visit: Payer: Self-pay | Admitting: Family Medicine

## 2022-06-20 DIAGNOSIS — Z1231 Encounter for screening mammogram for malignant neoplasm of breast: Secondary | ICD-10-CM

## 2022-07-04 ENCOUNTER — Ambulatory Visit
Admission: RE | Admit: 2022-07-04 | Discharge: 2022-07-04 | Disposition: A | Discharge status: Home / Self Care | Payer: BC Managed Care – PPO | Source: Ambulatory Visit | Attending: Family Medicine | Admitting: Family Medicine

## 2022-07-04 DIAGNOSIS — Z1231 Encounter for screening mammogram for malignant neoplasm of breast: Secondary | ICD-10-CM | POA: Diagnosis not present

## 2023-01-01 ENCOUNTER — Ambulatory Visit
Admission: RE | Admit: 2023-01-01 | Discharge: 2023-01-01 | Disposition: A | Discharge status: Home / Self Care | Payer: BC Managed Care – PPO | Source: Ambulatory Visit | Attending: Family Medicine | Admitting: Family Medicine

## 2023-01-01 DIAGNOSIS — N958 Other specified menopausal and perimenopausal disorders: Secondary | ICD-10-CM | POA: Diagnosis not present

## 2023-01-01 DIAGNOSIS — M8588 Other specified disorders of bone density and structure, other site: Secondary | ICD-10-CM | POA: Diagnosis not present

## 2023-01-01 DIAGNOSIS — E2839 Other primary ovarian failure: Secondary | ICD-10-CM

## 2023-01-05 ENCOUNTER — Other Ambulatory Visit: Payer: BC Managed Care – PPO

## 2023-04-24 DIAGNOSIS — H52223 Regular astigmatism, bilateral: Secondary | ICD-10-CM | POA: Diagnosis not present

## 2023-04-24 DIAGNOSIS — H524 Presbyopia: Secondary | ICD-10-CM | POA: Diagnosis not present

## 2023-04-24 DIAGNOSIS — H5203 Hypermetropia, bilateral: Secondary | ICD-10-CM | POA: Diagnosis not present

## 2023-05-22 DIAGNOSIS — D7589 Other specified diseases of blood and blood-forming organs: Secondary | ICD-10-CM | POA: Diagnosis not present

## 2023-05-22 DIAGNOSIS — F339 Major depressive disorder, recurrent, unspecified: Secondary | ICD-10-CM | POA: Diagnosis not present

## 2023-05-22 DIAGNOSIS — I10 Essential (primary) hypertension: Secondary | ICD-10-CM | POA: Diagnosis not present

## 2023-05-22 DIAGNOSIS — E782 Mixed hyperlipidemia: Secondary | ICD-10-CM | POA: Diagnosis not present

## 2023-05-22 DIAGNOSIS — Z8619 Personal history of other infectious and parasitic diseases: Secondary | ICD-10-CM | POA: Diagnosis not present

## 2023-06-15 ENCOUNTER — Other Ambulatory Visit: Payer: Self-pay | Admitting: Family Medicine

## 2023-06-15 DIAGNOSIS — Z1231 Encounter for screening mammogram for malignant neoplasm of breast: Secondary | ICD-10-CM

## 2023-07-10 ENCOUNTER — Ambulatory Visit
Admission: RE | Admit: 2023-07-10 | Discharge: 2023-07-10 | Disposition: A | Discharge status: Home / Self Care | Source: Ambulatory Visit

## 2023-07-10 DIAGNOSIS — Z1231 Encounter for screening mammogram for malignant neoplasm of breast: Secondary | ICD-10-CM

## 2023-08-01 ENCOUNTER — Other Ambulatory Visit: Payer: Self-pay

## 2023-08-01 ENCOUNTER — Emergency Department (HOSPITAL_BASED_OUTPATIENT_CLINIC_OR_DEPARTMENT_OTHER)
Admission: EM | Admit: 2023-08-01 | Discharge: 2023-08-02 | Disposition: A | Discharge status: Home / Self Care | Attending: Emergency Medicine | Admitting: Emergency Medicine

## 2023-08-01 ENCOUNTER — Emergency Department (HOSPITAL_BASED_OUTPATIENT_CLINIC_OR_DEPARTMENT_OTHER)

## 2023-08-01 ENCOUNTER — Encounter (HOSPITAL_BASED_OUTPATIENT_CLINIC_OR_DEPARTMENT_OTHER): Payer: Self-pay | Admitting: Emergency Medicine

## 2023-08-01 DIAGNOSIS — Z23 Encounter for immunization: Secondary | ICD-10-CM | POA: Insufficient documentation

## 2023-08-01 DIAGNOSIS — W19XXXA Unspecified fall, initial encounter: Secondary | ICD-10-CM | POA: Insufficient documentation

## 2023-08-01 DIAGNOSIS — S62631B Displaced fracture of distal phalanx of left index finger, initial encounter for open fracture: Secondary | ICD-10-CM | POA: Insufficient documentation

## 2023-08-01 DIAGNOSIS — S61211A Laceration without foreign body of left index finger without damage to nail, initial encounter: Secondary | ICD-10-CM

## 2023-08-01 DIAGNOSIS — S0081XA Abrasion of other part of head, initial encounter: Secondary | ICD-10-CM | POA: Diagnosis not present

## 2023-08-01 DIAGNOSIS — S60941A Unspecified superficial injury of left index finger, initial encounter: Secondary | ICD-10-CM | POA: Diagnosis not present

## 2023-08-01 DIAGNOSIS — S62601A Fracture of unspecified phalanx of left index finger, initial encounter for closed fracture: Secondary | ICD-10-CM | POA: Diagnosis not present

## 2023-08-01 MED ORDER — HYDROCODONE-ACETAMINOPHEN 5-325 MG PO TABS
1.0000 | ORAL_TABLET | Freq: Once | ORAL | Status: AC
  Administered 2023-08-02: 1 via ORAL
  Filled 2023-08-01: qty 1

## 2023-08-01 MED ORDER — CEPHALEXIN 250 MG PO CAPS
500.0000 mg | ORAL_CAPSULE | Freq: Once | ORAL | Status: AC
  Administered 2023-08-02: 500 mg via ORAL
  Filled 2023-08-01: qty 2

## 2023-08-01 MED ORDER — ONDANSETRON 4 MG PO TBDP
8.0000 mg | ORAL_TABLET | Freq: Once | ORAL | Status: AC
  Administered 2023-08-02: 8 mg via ORAL
  Filled 2023-08-01: qty 2

## 2023-08-01 MED ORDER — TETANUS-DIPHTH-ACELL PERTUSSIS 5-2.5-18.5 LF-MCG/0.5 IM SUSY
0.5000 mL | PREFILLED_SYRINGE | Freq: Once | INTRAMUSCULAR | Status: AC
  Administered 2023-08-02: 0.5 mL via INTRAMUSCULAR
  Filled 2023-08-01: qty 0.5

## 2023-08-01 NOTE — ED Triage Notes (Signed)
 Pt fell getting out of car and thinks she hit her finger on car or car door; LT index finger has gaping jagged laceration and appears deformed

## 2023-08-01 NOTE — ED Provider Notes (Signed)
 [place] [facility] Provider Note   CSN: [id number] Arrival date & time: 08/01/23  2246     Patient presents with: Finger Injury   Yvonne Pacheco is a 63 y.o. female.  {Add pertinent medical, surgical, social history, OB history to YEP:67052} The history is provided by the patient.  Patient presents after a fall and injuring her left index finger. Patient reports she was getting out of a truck when she stumbled and fell sustaining a laceration and injury to her left index finger.  She also scraped her face on the ground but has only minimal pain in her face.  No headache, no vomiting.  She may have had brief LOC but no mental status changes.  She has some bruising to her left knee but has been ambulatory without difficulty. No neck or back pain. She does admit to around 3 glasses of wine over the past several hours.  Last meal was around 9 PM    Prior to Admission medications   Medication Sig Start Date End Date Taking? Authorizing Provider  atorvastatin (LIPITOR) 10 MG tablet Take 10 mg by mouth daily.    [provider]  carvedilol (COREG) 6.25 MG tablet Take 1 tablet by mouth  twice a day 01/23/15   Shlomo Wilbert SAUNDERS, MD  citalopram (CELEXA) 40 MG tablet Take 40 mg by mouth daily.    [provider]  dicyclomine  (BENTYL ) 20 MG tablet Take 1 tablet (20 mg total) by mouth 3 (three) times daily as needed for spasms. 11/26/14   Pisciotta, Nat, PA-C  lisinopril-hydrochlorothiazide (PRINZIDE,ZESTORETIC) 20-12.5 MG tablet Take 2 tablets by mouth  daily 01/23/15   Turner, Wilbert SAUNDERS, MD  loratadine (CLARITIN) 10 MG tablet Take 10 mg by mouth as needed for allergies.    [provider]  Multiple Vitamin (MULTIVITAMIN) tablet Take 1 tablet by mouth daily.    [provider]  Omega-3 Fatty Acids (FISH OIL) 1000 MG CAPS Take 1 capsule by mouth 2 (two) times daily.    [provider]  valACYclovir (VALTREX) 500 MG  tablet Take 500 mg by mouth daily.    [provider]    Allergies: Erythromycin and Penicillins    Review of Systems  Constitutional:  Negative for fever.  Cardiovascular:  Negative for chest pain.  Gastrointestinal:  Negative for abdominal pain and vomiting.  Musculoskeletal:  Negative for back pain and neck pain.  Neurological:  Negative for headaches.    Updated Vital Signs BP (!) 147/81 (BP Location: Right Arm)   Pulse 85   Temp 98 F (36.7 C)   Resp 18   Ht 1.626 m (5' 4)   Wt 66.2 kg   SpO2 95%   BMI 25.06 kg/m   Physical Exam CONSTITUTIONAL: Well developed/well nourished HEAD: Normocephalic/atraumatic, no tenderness, no bruising to her scalp EYES: EOMI/PERRL, denies any pain with range of motion of eyes No orbital tenderness is noted ENMT: Abrasion noted to her left face, no crepitus, minimal tenderness. No dental injury.  Face is stable.  No nasal injury, no epistaxis. NECK: supple no meningeal signs SPINE/BACK:entire spine nontender No bruising/crepitance/stepoffs noted to spine CV: S1/S2 noted, no murmurs/rubs/gallops noted LUNGS: Lungs are clear to auscultation bilaterally, no apparent distress Chest-no tenderness noted ABDOMEN: soft, nontender NEURO: Pt is awake/alert/appropriate, moves all extremitiesx4.  No facial droop.  She moves all extremities out difficulty.  She is conversant and pleasant She is able to flex and extend the left index  finger though limited due to pain.  Denies any sensory deficit to the tip of the left index finger EXTREMITIES: Large laceration noted to the left index finger, see photo below.  Bleeding controlled.  Deformity is noted SKIN: warm, laceration noted to left index finger, see photo       (all labs ordered are listed, but only abnormal results are displayed) Labs Reviewed - No data to display  EKG: None  Radiology: DG Finger Index Left Result Date: 08/01/2023 CLINICAL DATA:  Pt states she was walking up  the steep driveway and up onto steps and she fell. Left index finger has large laceration. EXAM: LEFT INDEX FINGER 2+V COMPARISON:  None Available. FINDINGS: Acute open, intra-articular, volarly displaced, comminuted fracture of the second digit distal phalanx. No dislocation. There is no evidence of arthropathy or other focal bone abnormality. Soft tissue defect along the fracture with punctate densities that could represent fracture fragments versus retained radiopaque foreign bodies. IMPRESSION: 1. Acute open, intra-articular, volarly displaced, comminuted fracture of the second digit distal phalanx. 2. Soft tissue defect along the fracture with punctate densities that could represent fracture fragments versus retained radiopaque foreign bodies. Electronically Signed   By: Morgane  Naveau M.D.   On: 08/01/2023 23:40    {Document cardiac monitor, telemetry assessment procedure when appropriate:32947} Procedures   Medications Ordered in the ED  Tdap (BOOSTRIX ) injection 0.5 mL (has no administration in time range)  HYDROcodone -acetaminophen  (NORCO/VICODIN) 5-325 MG per tablet 1 tablet (has no administration in time range)  cephALEXin  (KEFLEX ) capsule 500 mg (has no administration in time range)  ondansetron  (ZOFRAN -ODT) disintegrating tablet 8 mg (has no administration in time range)      {Click here for ABCD2, HEART and other calculators REFRESH Note before signing:1}         Glasgow Coma Scale Score: 15       NEXUS Criteria Score: 0                Medical Decision Making Amount and/or Complexity of Data Reviewed Radiology: ordered.  Risk Prescription drug management.   ***  {Document critical care time when appropriate  Document review of labs and clinical decision tools ie CHADS2VASC2, etc  Document your independent review of radiology images and any outside records  Document your discussion with family members, caretakers and with consultants  Document social determinants of  health affecting pt's care  Document your decision making why or why not admission, treatments were needed:32947:::1}   Final diagnoses:  None    ED Discharge Orders     None

## 2023-08-01 NOTE — ED Notes (Signed)
 Pt present with 3cm x 1.5cm x .5cm  laceration to left index finger

## 2023-08-02 ENCOUNTER — Telehealth (HOSPITAL_BASED_OUTPATIENT_CLINIC_OR_DEPARTMENT_OTHER): Payer: Self-pay | Admitting: Emergency Medicine

## 2023-08-02 MED ORDER — HYDROCODONE-ACETAMINOPHEN 5-325 MG PO TABS
1.0000 | ORAL_TABLET | Freq: Four times a day (QID) | ORAL | 0 refills | Status: DC | PRN
Start: 2023-08-02 — End: 2023-08-02

## 2023-08-02 MED ORDER — HYDROCODONE-ACETAMINOPHEN 5-325 MG PO TABS: 1.0000 | ORAL_TABLET | Freq: Four times a day (QID) | ORAL | 0 refills | Status: DC | PRN

## 2023-08-02 MED ORDER — CEPHALEXIN 500 MG PO CAPS: 500.0000 mg | ORAL_CAPSULE | Freq: Three times a day (TID) | ORAL | 0 refills | Status: DC

## 2023-08-02 MED ORDER — LIDOCAINE HCL (PF) 1 % IJ SOLN
30.0000 mL | Freq: Once | INTRAMUSCULAR | Status: AC
  Administered 2023-08-02: 30 mL
  Filled 2023-08-02: qty 30

## 2023-08-02 NOTE — Telephone Encounter (Signed)
 Pharmacy called and stated that they did not get the patient's hydrocodone  prescription.  Will resend at this point in time.

## 2023-08-02 NOTE — Plan of Care (Signed)
 HAND SURGERY UPDATE NOTE:  Discussed patient with ED provider.  Patient has sustained laceration injury to distal aspect of index finger with underlying distal phalanx fracture.  Color and capillary refill is preserved to digit, intact sensation and ROM.  Recommended bedside irrigation and loose closure with splinting.  DC home with finger splint and antibiotics.  Will plan on having patient follow up in office on Monday am.  Will require formal I&D and fracture pinning.    Yvonne Mcfall, MD [facility], Hand Surgery

## 2023-08-02 NOTE — Discharge Instructions (Addendum)
 KEEP FINGER CLEAN/DRY AND LEAVE BANDAGE IN PLACE IF YOU CAN NOT CONTACT THE ORTHOPEDIC DOCTOR OR YOUR SYMPTOMS ARE WORSENING PLEASE GO TO THE ER

## 2023-08-03 ENCOUNTER — Other Ambulatory Visit: Payer: Self-pay

## 2023-08-03 ENCOUNTER — Other Ambulatory Visit: Payer: Self-pay | Admitting: Orthopedic Surgery

## 2023-08-03 ENCOUNTER — Encounter (HOSPITAL_BASED_OUTPATIENT_CLINIC_OR_DEPARTMENT_OTHER): Payer: Self-pay | Admitting: Orthopedic Surgery

## 2023-08-03 ENCOUNTER — Encounter (HOSPITAL_BASED_OUTPATIENT_CLINIC_OR_DEPARTMENT_OTHER)
Admission: RE | Admit: 2023-08-03 | Discharge: 2023-08-03 | Disposition: A | Discharge status: Home / Self Care | Source: Ambulatory Visit | Attending: Orthopedic Surgery | Admitting: Orthopedic Surgery

## 2023-08-03 ENCOUNTER — Ambulatory Visit: Admitting: Orthopedic Surgery

## 2023-08-03 ENCOUNTER — Encounter: Admitting: Orthopedic Surgery

## 2023-08-03 DIAGNOSIS — Z01818 Encounter for other preprocedural examination: Secondary | ICD-10-CM | POA: Insufficient documentation

## 2023-08-03 DIAGNOSIS — W19XXXA Unspecified fall, initial encounter: Secondary | ICD-10-CM | POA: Diagnosis not present

## 2023-08-03 DIAGNOSIS — I10 Essential (primary) hypertension: Secondary | ICD-10-CM | POA: Diagnosis not present

## 2023-08-03 DIAGNOSIS — S62631B Displaced fracture of distal phalanx of left index finger, initial encounter for open fracture: Secondary | ICD-10-CM | POA: Diagnosis not present

## 2023-08-03 DIAGNOSIS — F17210 Nicotine dependence, cigarettes, uncomplicated: Secondary | ICD-10-CM | POA: Diagnosis not present

## 2023-08-03 DIAGNOSIS — Z79899 Other long term (current) drug therapy: Secondary | ICD-10-CM | POA: Diagnosis not present

## 2023-08-03 LAB — BASIC METABOLIC PANEL WITH GFR
Anion gap: 11 (ref 5–15)
BUN: 18 mg/dL (ref 8–23)
CO2: 29 mmol/L (ref 22–32)
Calcium: 10 mg/dL (ref 8.9–10.3)
Chloride: 98 mmol/L (ref 98–111)
Creatinine, Ser: 0.68 mg/dL (ref 0.44–1.00)
GFR, Estimated: >60 mL/min
Glucose, Bld: 157 mg/dL — ABNORMAL HIGH (ref 70–99)
Potassium: 3.4 mmol/L — ABNORMAL LOW (ref 3.5–5.1)
Sodium: 138 mmol/L (ref 135–145)

## 2023-08-03 MED ORDER — HYDROCODONE-ACETAMINOPHEN 5-325 MG PO TABS: 1.0000 | ORAL_TABLET | Freq: Four times a day (QID) | ORAL | 0 refills | Status: DC | PRN

## 2023-08-03 NOTE — H&P (View-Only) (Signed)
 Yvonne Pacheco - 63 y.o. female MRN [id number]  Date of birth: [birth year]  Office Visit Note: Visit Date: 08/03/2023 PCP: Katina Pfeiffer, PA-C Referred by: Katina Pfeiffer, PA-C  Subjective: No chief complaint on file.  HPI: Yvonne Pacheco is a pleasant 63 y.o. female who presents today for evaluation of left index finger injury sustained 1 day prior.  Injury maximum described as mechanical fall.  She sustained a laceration injury to the left index finger with underlying pain and swelling.  Was seen in the emergency department setting the day of injury, underwent clinical and radiographic work which showed a distal phalanx fracture as well to the index finger.  He underwent thorough bedside irrigation and closure with splinting in the emergency department setting with close orthopedic hand surgical follow-up.  She is also been placed on oral antibiotics which she has been taking.  She has been doing dressing changes as instructed.  Denies any fevers or chills, no other systemic symptoms  Pertinent ROS were reviewed with the patient and found to be negative unless otherwise specified above in HPI.   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 Open displaced fracture of distal phalanx of left index finger, initial encounter     Plan: Extensive discussion was had the patient today regarding her left index finger injury.  She has sustained a laceration injury with an underlying distal phalanx fracture.  She received appropriate irrigation and debridement in the emergency department setting with  splinting and antibiotics.  She is indicated for left index finger formalized irrigation and debridement in the operating room setting with stabilization of the distal phalanx fracture with pinning.  Risks and benefits of the procedure were discussed, risks including but not limited to infection, bleeding, scarring, stiffness, nerve injury, tendon injury, vascular injury, hardware complication, nailbed deformity, recurrence of symptoms and need for subsequent operation.  We also discussed the appropriate postoperative protocol and timeframe for return to activities and function.  Patient expressed understanding.    I also did emphasize the importance of smoking cessation in the postoperative phase in order to limit complication risks.  Patient expressed understanding.  Surgery will be performed urgently tomorrow in the outpatient setting.   Follow-up: No follow-ups on file.   Meds & Orders: No orders of the defined types were placed in this encounter.  No orders of the defined types were placed in this encounter.    Procedures: No procedures performed      Clinical History: No specialty comments available.  She reports that she has been smoking cigarettes. She does not have any smokeless tobacco history on file. No results for input(s): HGBA1C, LABURIC in the last 8760 hours.  Objective:   Vital Signs: There were no vitals taken for this visit.  Physical Exam  Gen: Well-appearing, in no acute distress; non-toxic CV: Regular Rate. Well-perfused. Warm.  Resp: Breathing unlabored on room air; no wheezing. Psych: Fluid speech in conversation; appropriate affect; normal thought process  Ortho Exam Left index finger: - Laceration injury approximately 4 cm in length along the radial border of the digit, skin edges well-approximated with sutures in place, no erythema or drainage - Notable tenderness of the distal phalanx -  Nail plate is well secured beneath the eponychial fold, no  evidence of significant subungual hematoma - Able to perform gentle flexion/extension of the DIP - Normal color and capillary refill distally, sensation is intact  Imaging: No results found. Prior x-rays from the emergency department setting were reviewed in detail, x-rays indicate distal phalanx fracture of the index finger with notable displacement  Past Medical/Family/Surgical/Social History: Medications & Allergies reviewed per EMR, new medications updated. 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 Social History   Occupational History   Not on file  Tobacco Use   Smoking status: Every Day    Current packs/day: 0.50    Types: Cigarettes   Smokeless tobacco: Not on file  Substance and Sexual Activity   Alcohol use: Yes    Comment: 6-12 drinks weekly   Drug use: No   Sexual activity: Not on file    Yvonne Pacheco, M.D. [facility], Hand Surgery

## 2023-08-03 NOTE — Progress Notes (Signed)
 Yvonne Pacheco - 63 y.o. female MRN [id number]  Date of birth: [birth year]  Office Visit Note: Visit Date: 08/03/2023 PCP: Katina Pfeiffer, PA-C Referred by: Katina Pfeiffer, PA-C  Subjective: No chief complaint on file.  HPI: Yvonne Pacheco is a pleasant 63 y.o. female who presents today for evaluation of left index finger injury sustained 1 day prior.  Injury maximum described as mechanical fall.  She sustained a laceration injury to the left index finger with underlying pain and swelling.  Was seen in the emergency department setting the day of injury, underwent clinical and radiographic work which showed a distal phalanx fracture as well to the index finger.  He underwent thorough bedside irrigation and closure with splinting in the emergency department setting with close orthopedic hand surgical follow-up.  She is also been placed on oral antibiotics which she has been taking.  She has been doing dressing changes as instructed.  Denies any fevers or chills, no other systemic symptoms  Pertinent ROS were reviewed with the patient and found to be negative unless otherwise specified above in HPI.   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 Open displaced fracture of distal phalanx of left index finger, initial encounter     Plan: Extensive discussion was had the patient today regarding her left index finger injury.  She has sustained a laceration injury with an underlying distal phalanx fracture.  She received appropriate irrigation and debridement in the emergency department setting with  splinting and antibiotics.  She is indicated for left index finger formalized irrigation and debridement in the operating room setting with stabilization of the distal phalanx fracture with pinning.  Risks and benefits of the procedure were discussed, risks including but not limited to infection, bleeding, scarring, stiffness, nerve injury, tendon injury, vascular injury, hardware complication, nailbed deformity, recurrence of symptoms and need for subsequent operation.  We also discussed the appropriate postoperative protocol and timeframe for return to activities and function.  Patient expressed understanding.    I also did emphasize the importance of smoking cessation in the postoperative phase in order to limit complication risks.  Patient expressed understanding.  Surgery will be performed urgently tomorrow in the outpatient setting.   Follow-up: No follow-ups on file.   Meds & Orders: No orders of the defined types were placed in this encounter.  No orders of the defined types were placed in this encounter.    Procedures: No procedures performed      Clinical History: No specialty comments available.  She reports that she has been smoking cigarettes. She does not have any smokeless tobacco history on file. No results for input(s): HGBA1C, LABURIC in the last 8760 hours.  Objective:   Vital Signs: There were no vitals taken for this visit.  Physical Exam  Gen: Well-appearing, in no acute distress; non-toxic CV: Regular Rate. Well-perfused. Warm.  Resp: Breathing unlabored on room air; no wheezing. Psych: Fluid speech in conversation; appropriate affect; normal thought process  Ortho Exam Left index finger: - Laceration injury approximately 4 cm in length along the radial border of the digit, skin edges well-approximated with sutures in place, no erythema or drainage - Notable tenderness of the distal phalanx -  Nail plate is well secured beneath the eponychial fold, no  evidence of significant subungual hematoma - Able to perform gentle flexion/extension of the DIP - Normal color and capillary refill distally, sensation is intact  Imaging: No results found. Prior x-rays from the emergency department setting were reviewed in detail, x-rays indicate distal phalanx fracture of the index finger with notable displacement  Past Medical/Family/Surgical/Social History: Medications & Allergies reviewed per EMR, new medications updated. 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 Social History   Occupational History   Not on file  Tobacco Use   Smoking status: Every Day    Current packs/day: 0.50    Types: Cigarettes   Smokeless tobacco: Not on file  Substance and Sexual Activity   Alcohol use: Yes    Comment: 6-12 drinks weekly   Drug use: No   Sexual activity: Not on file    Yvonne Pacheco, M.D. [facility], Hand Surgery

## 2023-08-04 ENCOUNTER — Encounter (HOSPITAL_BASED_OUTPATIENT_CLINIC_OR_DEPARTMENT_OTHER): Payer: Self-pay | Admitting: Orthopedic Surgery

## 2023-08-04 ENCOUNTER — Encounter (HOSPITAL_BASED_OUTPATIENT_CLINIC_OR_DEPARTMENT_OTHER)
Admission: RE | Disposition: A | Discharge status: Home / Self Care | Payer: Self-pay | Source: Home / Self Care | Attending: Orthopedic Surgery

## 2023-08-04 ENCOUNTER — Ambulatory Visit (HOSPITAL_BASED_OUTPATIENT_CLINIC_OR_DEPARTMENT_OTHER): Admitting: Anesthesiology

## 2023-08-04 ENCOUNTER — Ambulatory Visit (HOSPITAL_BASED_OUTPATIENT_CLINIC_OR_DEPARTMENT_OTHER)

## 2023-08-04 ENCOUNTER — Ambulatory Visit (HOSPITAL_BASED_OUTPATIENT_CLINIC_OR_DEPARTMENT_OTHER)
Admission: RE | Admit: 2023-08-04 | Discharge: 2023-08-04 | Disposition: A | Discharge status: Home / Self Care | Attending: Orthopedic Surgery | Admitting: Orthopedic Surgery

## 2023-08-04 DIAGNOSIS — Z01818 Encounter for other preprocedural examination: Secondary | ICD-10-CM | POA: Insufficient documentation

## 2023-08-04 DIAGNOSIS — S62631B Displaced fracture of distal phalanx of left index finger, initial encounter for open fracture: Secondary | ICD-10-CM | POA: Diagnosis not present

## 2023-08-04 DIAGNOSIS — F17210 Nicotine dependence, cigarettes, uncomplicated: Secondary | ICD-10-CM | POA: Insufficient documentation

## 2023-08-04 DIAGNOSIS — Z79899 Other long term (current) drug therapy: Secondary | ICD-10-CM | POA: Diagnosis not present

## 2023-08-04 DIAGNOSIS — W19XXXA Unspecified fall, initial encounter: Secondary | ICD-10-CM | POA: Diagnosis not present

## 2023-08-04 DIAGNOSIS — I10 Essential (primary) hypertension: Secondary | ICD-10-CM | POA: Diagnosis not present

## 2023-08-04 HISTORY — PX: OPEN REDUCTION INTERNAL FIXATION (ORIF) DISTAL PHALANX: SHX6236

## 2023-08-04 HISTORY — PX: INCISION AND DRAINAGE OF WOUND: SHX1803

## 2023-08-04 SURGERY — OPEN REDUCTION INTERNAL FIXATION (ORIF) DISTAL PHALANX
Anesthesia: Monitor Anesthesia Care | Site: Index Finger | Laterality: Left

## 2023-08-04 MED ORDER — OXYCODONE HCL 5 MG/5ML PO SOLN: 5.0000 mg | Freq: Once | ORAL | Status: DC | PRN

## 2023-08-04 MED ORDER — CEFAZOLIN SODIUM-DEXTROSE 2-4 GM/100ML-% IV SOLN
INTRAVENOUS | Status: AC
  Filled 2023-08-04: qty 100

## 2023-08-04 MED ORDER — EPHEDRINE 5 MG/ML INJ
INTRAVENOUS | Status: AC
  Filled 2023-08-04: qty 5

## 2023-08-04 MED ORDER — LIDOCAINE HCL 1 % IJ SOLN
INTRAMUSCULAR | Status: DC | PRN
  Administered 2023-08-04: 10 mL

## 2023-08-04 MED ORDER — LIDOCAINE 2% (20 MG/ML) 5 ML SYRINGE
INTRAMUSCULAR | Status: DC | PRN
  Administered 2023-08-04: 60 mg via INTRAVENOUS

## 2023-08-04 MED ORDER — EPHEDRINE SULFATE (PRESSORS) 50 MG/ML IJ SOLN
INTRAMUSCULAR | Status: DC | PRN
Start: 2023-08-04 — End: 2023-08-04
  Administered 2023-08-04: 10 mg via INTRAVENOUS

## 2023-08-04 MED ORDER — ONDANSETRON HCL 4 MG/2ML IJ SOLN
INTRAMUSCULAR | Status: AC
  Filled 2023-08-04: qty 2

## 2023-08-04 MED ORDER — ONDANSETRON HCL 4 MG/2ML IJ SOLN
INTRAMUSCULAR | Status: DC | PRN
  Administered 2023-08-04: 4 mg via INTRAVENOUS

## 2023-08-04 MED ORDER — CEFAZOLIN SODIUM-DEXTROSE 2-4 GM/100ML-% IV SOLN
2.0000 g | INTRAVENOUS | Status: AC
  Administered 2023-08-04: 2 g via INTRAVENOUS

## 2023-08-04 MED ORDER — PROPOFOL 500 MG/50ML IV EMUL
INTRAVENOUS | Status: DC | PRN
  Administered 2023-08-04: 100 ug/kg/min via INTRAVENOUS

## 2023-08-04 MED ORDER — FENTANYL CITRATE (PF) 100 MCG/2ML IJ SOLN
INTRAMUSCULAR | Status: DC | PRN
  Administered 2023-08-04 (×4): 25 ug via INTRAVENOUS

## 2023-08-04 MED ORDER — FENTANYL CITRATE (PF) 100 MCG/2ML IJ SOLN
INTRAMUSCULAR | Status: AC
  Filled 2023-08-04: qty 2

## 2023-08-04 MED ORDER — MIDAZOLAM HCL 2 MG/2ML IJ SOLN
INTRAMUSCULAR | Status: AC
  Filled 2023-08-04: qty 2

## 2023-08-04 MED ORDER — OXYCODONE HCL 5 MG PO TABS: 5.0000 mg | ORAL_TABLET | Freq: Once | ORAL | Status: DC | PRN

## 2023-08-04 MED ORDER — FENTANYL CITRATE (PF) 100 MCG/2ML IJ SOLN: 25.0000 ug | INTRAMUSCULAR | Status: DC | PRN

## 2023-08-04 MED ORDER — LACTATED RINGERS IV SOLN: INTRAVENOUS | Status: DC

## 2023-08-04 MED ORDER — 0.9 % SODIUM CHLORIDE (POUR BTL) OPTIME
TOPICAL | Status: DC | PRN
Start: 2023-08-04 — End: 2023-08-04
  Administered 2023-08-04: 1000 mL

## 2023-08-04 MED ORDER — LIDOCAINE 2% (20 MG/ML) 5 ML SYRINGE
INTRAMUSCULAR | Status: AC
  Filled 2023-08-04: qty 5

## 2023-08-04 MED ORDER — MIDAZOLAM HCL 2 MG/2ML IJ SOLN
INTRAMUSCULAR | Status: DC | PRN
  Administered 2023-08-04: 2 mg via INTRAVENOUS

## 2023-08-04 MED ORDER — ACETAMINOPHEN 10 MG/ML IV SOLN: 1000.0000 mg | Freq: Once | INTRAVENOUS | Status: DC | PRN

## 2023-08-04 SURGICAL SUPPLY — 50 items
BLADE SURG 15 STRL LF DISP TIS (BLADE) ×4 IMPLANT
BNDG COHESIVE 1X5 TAN STRL LF (GAUZE/BANDAGES/DRESSINGS) IMPLANT
BNDG COHESIVE 4X5 TAN STRL LF (GAUZE/BANDAGES/DRESSINGS) ×2 IMPLANT
BNDG COMPR ESMARK 4X3 LF (GAUZE/BANDAGES/DRESSINGS) ×2 IMPLANT
BNDG ELASTIC 4INX 5YD STR LF (GAUZE/BANDAGES/DRESSINGS) ×4 IMPLANT
BNDG GAUZE DERMACEA FLUFF 4 (GAUZE/BANDAGES/DRESSINGS) ×2 IMPLANT
CAP PIN PROTECTOR ORTHO WHT (CAP) IMPLANT
CHLORAPREP W/TINT 26 (MISCELLANEOUS) ×2 IMPLANT
CORD BIPOLAR FORCEPS 12FT (ELECTRODE) ×2 IMPLANT
COVER BACK TABLE 60X90IN (DRAPES) ×2 IMPLANT
CUFF TOURN SGL QUICK 18X4 (TOURNIQUET CUFF) IMPLANT
CUFF TRNQT CYL 24X4X16.5-23 (TOURNIQUET CUFF) ×2 IMPLANT
DRAPE HAND 77X146 (DRAPES) ×2 IMPLANT
DRAPE OEC MINIVIEW 54X84 (DRAPES) ×2 IMPLANT
DRAPE SURG 17X23 STRL (DRAPES) ×2 IMPLANT
GAUZE SPONGE 4X4 12PLY STRL (GAUZE/BANDAGES/DRESSINGS) ×2 IMPLANT
GAUZE STRETCH 2X75IN STRL (MISCELLANEOUS) IMPLANT
GAUZE XEROFORM 1X8 LF (GAUZE/BANDAGES/DRESSINGS) ×2 IMPLANT
GLOVE BIO SURGEON STRL SZ7.5 (GLOVE) ×4 IMPLANT
GLOVE BIOGEL PI IND STRL 7.0 (GLOVE) IMPLANT
GLOVE BIOGEL PI IND STRL 7.5 (GLOVE) ×2 IMPLANT
GLOVE SURG SS PI 7.0 STRL IVOR (GLOVE) IMPLANT
GOWN STRL REUS W/ TWL LRG LVL3 (GOWN DISPOSABLE) ×2 IMPLANT
GOWN STRL REUS W/ TWL XL LVL3 (GOWN DISPOSABLE) IMPLANT
GOWN STRL REUS W/TWL XL LVL3 (GOWN DISPOSABLE) ×2 IMPLANT
GOWN STRL SURGICAL XL XLNG (GOWN DISPOSABLE) ×4 IMPLANT
KWIRE DBL .035X4 NSTRL (WIRE) IMPLANT
KWIRE DBL .045X4 NSTRL (WIRE) IMPLANT
MANIFOLD NEPTUNE II (INSTRUMENTS) ×2 IMPLANT
NDL HYPO 25X5/8 SAFETYGLIDE (NEEDLE) IMPLANT
NEEDLE HYPO 25X5/8 SAFETYGLIDE (NEEDLE) ×1 IMPLANT
NS IRRIG 1000ML POUR BTL (IV SOLUTION) IMPLANT
PACK BASIN DAY SURGERY FS (CUSTOM PROCEDURE TRAY) ×2 IMPLANT
PAD CAST 4YDX4 CTTN HI CHSV (CAST SUPPLIES) ×4 IMPLANT
SHEET MEDIUM DRAPE 40X70 STRL (DRAPES) ×2 IMPLANT
SLEEVE SCD COMPRESS KNEE MED (STOCKING) IMPLANT
SPIKE FLUID TRANSFER (MISCELLANEOUS) IMPLANT
SPLINT FINGER 2.25 911902 (SOFTGOODS) IMPLANT
SPLINT PLASTER CAST XFAST 4X15 (CAST SUPPLIES) ×20 IMPLANT
STOCKINETTE IMPERVIOUS 9X36 MD (GAUZE/BANDAGES/DRESSINGS) ×2 IMPLANT
SUCTION TUBE FRAZIER 10FR DISP (SUCTIONS) IMPLANT
SUT ETHILON 4 0 PS 2 18 (SUTURE) ×2 IMPLANT
SUT MNCRL AB 3-0 PS2 27 (SUTURE) ×2 IMPLANT
SUT VIC AB 3-0 FS2 27 (SUTURE) IMPLANT
SUT VIC AB 4-0 PS2 18 (SUTURE) IMPLANT
SYR BULB EAR ULCER 3OZ GRN STR (SYRINGE) ×4 IMPLANT
SYR CONTROL 10ML LL (SYRINGE) IMPLANT
TOWEL GREEN STERILE FF (TOWEL DISPOSABLE) ×4 IMPLANT
TUBE CONNECTING 20X1/4 (TUBING) IMPLANT
UNDERPAD 30X36 HEAVY ABSORB (UNDERPADS AND DIAPERS) ×2 IMPLANT

## 2023-08-04 NOTE — Transfer of Care (Signed)
 Immediate Anesthesia Transfer of Care Note  Patient: Yvonne Pacheco  Procedure(s) Performed: OPEN REDUCTION INTERNAL FIXATION (ORIF) DISTAL PHALANX WITH PINNING (Left: Index Finger) IRRIGATION AND DEBRIDEMENT WOUND (Left: Index Finger)  Patient Location: PACU  Anesthesia Type:MAC  Level of Consciousness: awake, alert , and oriented  Airway & Oxygen Therapy: Patient Spontanous Breathing  Post-op Assessment: Report given to RN  Post vital signs: Reviewed and stable  Last Vitals:  Vitals Value Taken Time  BP 111/75 08/04/23 15:57  Temp    Pulse 79 08/04/23 15:57  Resp 13 08/04/23 15:57  SpO2 95 % 08/04/23 15:57  Vitals shown include unfiled device data.  Last Pain:  Vitals:   08/04/23 1155  TempSrc: Temporal  PainSc: 5          Complications: No notable events documented.

## 2023-08-04 NOTE — Discharge Instructions (Addendum)
   Hand Surgery Postop Instructions   Dressings: Maintain postoperative dressing until orthopedic follow-up.  Keep operative site clean and dry until orthopedic follow-up.  Wound Care: Keep your hand elevated above the level of your heart.  Do not allow it to dangle by your side. Moving your fingers is advised to stimulate circulation but will depend on the site of your surgery.  If you have a splint applied, your doctor will advise you regarding movement.  Activity: Do not drive or operate machinery until clearance given from physician. No heavy lifting with operative extremity.  Diet:  Drink liquids today or eat a light diet.  You may resume a regular diet tomorrow.    General expectations: Take prescribed medication if given, transition to over-the-counter medication as quickly as possible. Fingers may become slightly swollen.  Call your doctor if any of the following occur: Severe pain not relieved by pain medication. Elevated temperature. Dressing soaked with blood. Inability to move fingers. White or bluish color to fingers.   Per [facility] clinic policy, our goal is ensure optimal postoperative pain control with a multimodal pain management strategy. For all [facility] patients, our goal is to wean post-operative narcotic medications by 6 weeks post-operatively. If this is not possible due to utilization of pain medication prior to surgery, your [facility] doctor will support your acute post-operative pain control for the first 6 weeks postoperatively, with a plan to transition you back to your primary pain team following that. Cyndia Skeeters will work to ensure a Therapist, occupational.  Anshul Trevor Mace, M.D. Hand Surgery [facility]   Post Anesthesia Home Care Instructions  Activity: Get plenty of rest for the remainder of the day. A responsible individual must stay with you for 24 hours following the procedure.  For the next 24 hours, DO NOT: -Drive a  car -Advertising copywriter -Drink alcoholic beverages -Take any medication unless instructed by your physician -Make any legal decisions or sign important papers.  Meals: Start with liquid foods such as gelatin or soup. Progress to regular foods as tolerated. Avoid greasy, spicy, heavy foods. If nausea and/or vomiting occur, drink only clear liquids until the nausea and/or vomiting subsides. Call your physician if vomiting continues.  Special Instructions/Symptoms: Your throat may feel dry or sore from the anesthesia or the breathing tube placed in your throat during surgery. If this causes discomfort, gargle with warm salt water. The discomfort should disappear within 24 hours.  If you had a scopolamine patch placed behind your ear for the management of post- operative nausea and/or vomiting:  1. The medication in the patch is effective for 72 hours, after which it should be removed.  Wrap patch in a tissue and discard in the trash. Wash hands thoroughly with soap and water. 2. You may remove the patch earlier than 72 hours if you experience unpleasant side effects which may include dry mouth, dizziness or visual disturbances. 3. Avoid touching the patch. Wash your hands with soap and water after contact with the patch.

## 2023-08-04 NOTE — Anesthesia Postprocedure Evaluation (Signed)
 Anesthesia Post Note  Patient: Yvonne Pacheco  Procedure(s) Performed: OPEN REDUCTION INTERNAL FIXATION (ORIF) DISTAL PHALANX WITH PINNING (Left: Index Finger) IRRIGATION AND DEBRIDEMENT WOUND (Left: Index Finger)     Patient location during evaluation: PACU Anesthesia Type: MAC Level of consciousness: awake and alert Pain management: pain level controlled Vital Signs Assessment: post-procedure vital signs reviewed and stable Respiratory status: spontaneous breathing, nonlabored ventilation and respiratory function stable Cardiovascular status: stable and blood pressure returned to baseline Postop Assessment: no apparent nausea or vomiting Anesthetic complications: no   No notable events documented.  Last Vitals:  Vitals:   08/04/23 1630 08/04/23 1631  BP: (!) 110/54   Pulse:  70  Resp: 12 15  Temp:    SpO2: 98% 96%    Last Pain:  Vitals:   08/04/23 1557  TempSrc:   PainSc: 0-No pain                 Shadasia Oldfield

## 2023-08-04 NOTE — Anesthesia Preprocedure Evaluation (Addendum)
 Anesthesia Evaluation  Patient identified by MRN, date of birth, ID band Patient awake    Reviewed: Allergy & Precautions, NPO status , Patient's Chart, lab work & pertinent test results  History of Anesthesia Complications Negative for: history of anesthetic complications  Airway Mallampati: III  TM Distance: >3 FB Neck ROM: Full    Dental  (+) Teeth Intact, Dental Advisory Given   Pulmonary neg shortness of breath, neg COPD, neg recent URI, Current Smoker and Patient abstained from smoking.   breath sounds clear to auscultation       Cardiovascular hypertension, Pt. on medications (-) angina (-) Past MI + Valvular Problems/Murmurs MR  Rhythm:Regular     Neuro/Psych negative neurological ROS  negative psych ROS   GI/Hepatic negative GI ROS, Neg liver ROS,,,  Endo/Other  negative endocrine ROS    Renal/GU negative Renal ROS     Musculoskeletal   Abdominal   Peds  Hematology negative hematology ROS (+)   Anesthesia Other Findings   Reproductive/Obstetrics                              Anesthesia Physical Anesthesia Plan  ASA: 2  Anesthesia Plan: MAC   Post-op Pain Management: Toradol IV (intra-op)*   Induction: Intravenous  PONV Risk Score and Plan: 2 and Ondansetron  and Propofol  infusion  Airway Management Planned: Nasal Cannula, Natural Airway and Simple Face Mask  Additional Equipment: None  Intra-op Plan:   Post-operative Plan:   Informed Consent: I have reviewed the patients History and Physical, chart, labs and discussed the procedure including the risks, benefits and alternatives for the proposed anesthesia with the patient or authorized representative who has indicated his/her understanding and acceptance.     Dental advisory given  Plan Discussed with: CRNA  Anesthesia Plan Comments:          Anesthesia Quick Evaluation

## 2023-08-04 NOTE — Anesthesia Procedure Notes (Signed)
 Procedure Name: MAC Date/Time: 08/04/2023 1:13 PM  Performed by: Pam Macario BROCKS, CRNAPre-anesthesia Checklist: Patient identified, Emergency Drugs available, Suction available, Patient being monitored and Timeout performed Patient Re-evaluated:Patient Re-evaluated prior to induction Oxygen Delivery Method: Simple face mask Preoxygenation: Pre-oxygenation with 100% oxygen Induction Type: IV induction Placement Confirmation: breath sounds checked- equal and bilateral, CO2 detector and positive ETCO2

## 2023-08-04 NOTE — Interval H&P Note (Signed)
 History and Physical Interval Note:  08/04/2023 1:59 PM  Yvonne Pacheco  has presented today for surgery, with the diagnosis of LEFT INDEX OPEN DISTAL PHALANX FRACTURE.  The various methods of treatment have been discussed with the patient and family. After consideration of risks, benefits and other options for treatment, the patient has consented to  Procedure(s) with comments: OPEN REDUCTION INTERNAL FIXATION (ORIF) DISTAL PHALANX (Left) - LEFT INDEX FINGER IRRIGATION AND DEBRIDEMENT / OPEN REDUCTION AND PINNING DISTAL PHALANX IRRIGATION AND DEBRIDEMENT WOUND (Left) as a surgical intervention.  The patient's history has been reviewed, patient examined, no change in status, stable for surgery.  I have reviewed the patient's chart and labs.  Questions were answered to the patient's satisfaction.     [name]

## 2023-08-04 NOTE — Op Note (Signed)
 NAME: Yvonne Pacheco MEDICAL RECORD NO: [id number] DATE OF BIRTH: [birth year] FACILITY: [facility] LOCATION: [facility] PHYSICIAN: GILDARDO ALDERTON, MD   OPERATIVE REPORT   DATE OF PROCEDURE: 08/04/23    PREOPERATIVE DIAGNOSIS: Left index finger open distal phalanx fracture   POSTOPERATIVE DIAGNOSIS: Left index finger open distal phalanx fracture   PROCEDURE: Left index finger open distal phalanx fracture irrigation and debridement Left index finger open distal phalanx fracture stabilization with pinning   SURGEON:  GILDARDO ALDERTON, M.D.   ASSISTANT: Almeda Rummer, PA   ANESTHESIA:  Local with sedation   INTRAVENOUS FLUIDS:  Per anesthesia flow sheet.   ESTIMATED BLOOD LOSS:  Minimal.   COMPLICATIONS:  None.   SPECIMENS:  none   TOURNIQUET TIME: Not utilized   DISPOSITION:  Stable to PACU.   INDICATIONS: 63 year old female sustained a left index finger injury as part of a mechanical fall.  She was seen in the emergency department setting the day of injury, underwent clinical and radiographic work which showed open distal phalanx fracture of the index finger.  She underwent thorough bedside irrigation and closure with splinting the emergency department setting with close orthopedic hand surgical follow-up.  Patient was indicated for left index finger formalized irrigation and debridement and stabilization of the open distal phalanx fracture.  Risks and benefits of surgery were discussed including the risks of infection, bleeding, scarring, stiffness, nerve injury, vascular injury, tendon injury, need for subsequent operation, hardware failure, persistent nailbed deformity, nonunion, malunion.  She voiced understanding of these risks and elected to proceed.  OPERATIVE COURSE: Patient was seen and identified in the preoperative area and marked appropriately.  Surgical consent had been signed. Preoperative IV antibiotic prophylaxis was given. She was transferred to the  operating room and placed in supine position with the Left Left upper extremity on an arm board.  Sedation was induced by the anesthesiologist.  Left upper extremity was prepped and draped in normal sterile orthopedic fashion.  A surgical pause was performed between the surgeons, anesthesia, and operating room staff and all were in agreement as to the patient, procedure, and site of procedure.  Tourniquet was placed and padded appropriately the left upper arm however was not utilized for this case.  10 cc of 1% lidocaine  plain was utilized for digital block purposes of the index finger.  First began with copious irrigation of the previous laceration.  Prior sutures were removed, underlying muscle and soft tissue was visualized.  No significant contamination was encountered.  Copious irrigation was performed, devitalized tissue was sharply debrided utilizing a 15 blade.  Once were satisfied with our irrigation and debridement, attention was turned to stabilization of the distal phalanx fracture.  0.045 K wire was introduced in retrograde fashion across the distal phalanx, gentle reduction maneuver was performed under biplanar fluoroscopy.  K wire was placed across the fracture site, fluoroscopy was utilized to confirm appropriate fracture reduction.  K wire was then inserted across the DIP joint and anchored into the middle phalanx for additional stability.  Wire was then bent and cut appropriately.  Biplanar fluoroscopy once again confirmed appropriate fracture reduction and wire placement.  Sterile dressings were then provide utilizing Xeroform, 4 x 4's, Kerlix and Coban.  Finger splint was applied to the index finger.  The operative drapes were broken down.  The patient was awoken from anesthesia safely and taken to PACU in stable condition.   Post-operative plan: The patient will recover in the post-anesthesia care unit and then be  discharged home.  The patient will be non weight bearing on the left  upper extremity in a splint for the index finger.   I will see the patient back in the office in 1 week for postoperative followup.  Discharge instructions were provided to maintain dressing, pain medication was provided.  Larua Collier, MD Electronically signed, 08/04/23

## 2023-08-05 ENCOUNTER — Encounter (HOSPITAL_BASED_OUTPATIENT_CLINIC_OR_DEPARTMENT_OTHER): Payer: Self-pay | Admitting: Orthopedic Surgery

## 2023-08-11 ENCOUNTER — Other Ambulatory Visit: Payer: Self-pay | Admitting: Orthopedic Surgery

## 2023-08-11 ENCOUNTER — Ambulatory Visit (INDEPENDENT_AMBULATORY_CARE_PROVIDER_SITE_OTHER): Admitting: Orthopedic Surgery

## 2023-08-11 ENCOUNTER — Other Ambulatory Visit (INDEPENDENT_AMBULATORY_CARE_PROVIDER_SITE_OTHER): Payer: Self-pay

## 2023-08-11 DIAGNOSIS — S62631B Displaced fracture of distal phalanx of left index finger, initial encounter for open fracture: Secondary | ICD-10-CM

## 2023-08-11 MED ORDER — HYDROCODONE-ACETAMINOPHEN 5-325 MG PO TABS: 1.0000 | ORAL_TABLET | Freq: Four times a day (QID) | ORAL | 0 refills | Status: AC | PRN

## 2023-08-11 MED ORDER — CEPHALEXIN 500 MG PO CAPS: 500.0000 mg | ORAL_CAPSULE | Freq: Three times a day (TID) | ORAL | 0 refills | Status: AC

## 2023-08-11 NOTE — Progress Notes (Signed)
   [name] - 63 y.o. female MRN [id number]  Date of birth: 04-May-1960  Office Visit Note: Visit Date: 08/11/2023 PCP: Katina Pfeiffer, PA-C Referred by: Katina Pfeiffer, PA-C  Subjective:  HPI: Yvonne Pacheco is a 63 y.o. female who presents today for follow up 1 week status post irrigation debridement with pinning of open distal phalanx fracture index finger.  She is doing well overall, does have some ongoing hypersensitivity at the distal aspect.  Has been compliant with the dressing as instructed.  Pertinent ROS were reviewed with the patient and found to be negative unless otherwise specified above in HPI.   Assessment & Plan: Visit Diagnoses:  1. Open displaced fracture of distal phalanx of left index finger, initial encounter     Plan: She is doing well overall, x-rays today show stable appearance of the distal phalanx fracture with pinning.  Wounds are well-healing.  There is some slight serous drainage on the dressings.  She is instructed to do daily dressing changes, supplies were provided today.  Finger splint was also given.  Follow-up in 1 week for recheck  Follow-up: No follow-ups on file.   Meds & Orders:  Meds ordered this encounter  Medications   cephALEXin  (KEFLEX ) 500 MG capsule    Sig: Take 1 capsule (500 mg total) by mouth 3 (three) times daily.    Dispense:  21 capsule    Refill:  0    Orders Placed This Encounter  Procedures   XR Finger Index Left     Procedures: No procedures performed       Objective:   Vital Signs: There were no vitals taken for this visit.  Ortho Exam Left index finger with pin site clean dry and intact, wounds are well-healing, slight serous drainage on the dressing, no evidence of purulence, no erythema or significant swelling  Imaging: XR Finger Index Left Result Date: 08/11/2023 X-rays left index finger demonstrate stable appearance of the pin fixation of the distal phalanx fracture, pin is spanning the DIP  joint, is well-seated in all planes    Martina Brodbeck Estela) Arlinda, M.D. [facility], Hand Surgery

## 2023-08-18 ENCOUNTER — Other Ambulatory Visit (INDEPENDENT_AMBULATORY_CARE_PROVIDER_SITE_OTHER): Payer: Self-pay

## 2023-08-18 ENCOUNTER — Ambulatory Visit (INDEPENDENT_AMBULATORY_CARE_PROVIDER_SITE_OTHER): Admitting: Orthopedic Surgery

## 2023-08-18 DIAGNOSIS — S62631B Displaced fracture of distal phalanx of left index finger, initial encounter for open fracture: Secondary | ICD-10-CM

## 2023-08-18 NOTE — Progress Notes (Signed)
   Yvonne Pacheco - 63 y.o. female MRN [id number]  Date of birth: 1960-02-26  Office Visit Note: Visit Date: 08/18/2023 PCP: Katina Pfeiffer, PA-C Referred by: Katina Pfeiffer, PA-C  Subjective:  HPI: Yvonne Pacheco is a 63 y.o. female who presents today for follow up 2 week status post irrigation debridement with pinning of open distal phalanx fracture index finger.  She is doing well overall, hypersensitivity improving.  Pertinent ROS were reviewed with the patient and found to be negative unless otherwise specified above in HPI.   Assessment & Plan: Visit Diagnoses:  1. Open displaced fracture of distal phalanx of left index finger, initial encounter     Plan: She is doing well overall, x-rays today continue to show stable appearance of the distal phalanx fracture with pinning.  Wounds are well-healing.  Continue with basic wound care and daily dressing changes, supplies were provided today.  Finger splint was also replaced.  Follow-up in 1 week for recheck  Follow-up: No follow-ups on file.   Meds & Orders:  No orders of the defined types were placed in this encounter.   Orders Placed This Encounter  Procedures   XR Finger Index Left     Procedures: No procedures performed       Objective:   Vital Signs: There were no vitals taken for this visit.  Ortho Exam Left index finger with pin site clean dry and intact, wounds are well-healing, slightly macerated appearance, sutures in place, no evidence of purulence, no erythema or significant swelling  Imaging: XR Finger Index Left Result Date: 08/18/2023 X-rays of the index finger continue to demonstrate stable appearance of distal phalanx fracture with appropriate pin fixation across the DIP joint.  Pin remains well-fixed in all planes.     Nichael Ehly Afton Alderton, M.D. [facility], Hand Surgery

## 2023-08-25 ENCOUNTER — Ambulatory Visit (INDEPENDENT_AMBULATORY_CARE_PROVIDER_SITE_OTHER): Admitting: Orthopedic Surgery

## 2023-08-25 DIAGNOSIS — S62631D Displaced fracture of distal phalanx of left index finger, subsequent encounter for fracture with routine healing: Secondary | ICD-10-CM

## 2023-08-25 NOTE — Progress Notes (Signed)
   Yvonne Pacheco - 63 y.o. female MRN [id number]  Date of birth: May 01, 1960  Office Visit Note: Visit Date: 08/25/2023 PCP: Katina Pfeiffer, PA-C Referred by: Katina Pfeiffer, PA-C  Subjective:  HPI: Yvonne Pacheco is a 63 y.o. female who presents today for follow up 3 week status post irrigation debridement with pinning of open distal phalanx fracture index finger.  She is doing well overall, hypersensitivity improving.  Pertinent ROS were reviewed with the patient and found to be negative unless otherwise specified above in HPI.   Assessment & Plan: Visit Diagnoses:  No diagnosis found.   Plan: She is doing well overall.  Wounds are well-healing.  Continue with basic wound care and daily dressing changes, supplies were provided today.  Finger splint was also replaced.  Follow-up in 1 week for recheck.  We will perform x-rays next week and likely pin removal at that time.  Follow-up: No follow-ups on file.   Meds & Orders:  No orders of the defined types were placed in this encounter.   No orders of the defined types were placed in this encounter.    Procedures: No procedures performed       Objective:   Vital Signs: There were no vitals taken for this visit.  Ortho Exam Left index finger with pin site clean dry and intact, wounds are well-healing, slightly macerated appearance, sutures removed today, no evidence of purulence, no erythema or significant swelling  Imaging: No results found.     Shandra Szymborski Afton Alderton, M.D. [facility], Hand Surgery

## 2023-08-27 ENCOUNTER — Encounter: Admitting: Orthopedic Surgery

## 2023-08-28 ENCOUNTER — Encounter: Payer: Self-pay | Admitting: Orthopedic Surgery

## 2023-09-03 ENCOUNTER — Ambulatory Visit: Admitting: Orthopedic Surgery

## 2023-09-03 ENCOUNTER — Other Ambulatory Visit (INDEPENDENT_AMBULATORY_CARE_PROVIDER_SITE_OTHER): Payer: Self-pay

## 2023-09-03 DIAGNOSIS — S62631D Displaced fracture of distal phalanx of left index finger, subsequent encounter for fracture with routine healing: Secondary | ICD-10-CM

## 2023-09-03 NOTE — Progress Notes (Signed)
   Yvonne Pacheco - 63 y.o. female MRN [id number]  Date of birth: Oct 03, 1960  Office Visit Note: Visit Date: 09/03/2023 PCP: Katina Pfeiffer, PA-C Referred by: Katina Pfeiffer, PA-C  Subjective:  HPI: Yvonne Pacheco is a 63 y.o. female who presents today for follow up 4 weeks status post irrigation debridement with pinning of open distal phalanx fracture index finge .  Pertinent ROS were reviewed with the patient and found to be negative unless otherwise specified above in HPI.   Assessment & Plan: Visit Diagnoses:  1. Open displaced fracture of distal phalanx of left index finger with routine healing, subsequent encounter     Plan: She is doing well overall.  Xrays demonstrate appropriate healing.  Pin removal performed today. Cap splint given. Ok for ROM exercises, remain non weight bearing, follow up 2 weeks.  Follow-up: No follow-ups on file.   Meds & Orders: No orders of the defined types were placed in this encounter.   Orders Placed This Encounter  Procedures   XR Finger Index Left     Procedures: No procedures performed       Objective:   Vital Signs: There were no vitals taken for this visit.  Ortho Exam Left index finger with pin site clean dry and intact, pin removed without issue, wounds are well-healing, no erythema or significant swelling   Imaging: XR Finger Index Left Result Date: 09/03/2023 Xrays demonstrate appropriate interval healing distal phalanx fracture     Yvonne Kroboth Estela) Alene Bergerson, M.D.  [facility]

## 2023-09-23 ENCOUNTER — Other Ambulatory Visit (INDEPENDENT_AMBULATORY_CARE_PROVIDER_SITE_OTHER): Payer: Self-pay

## 2023-09-23 ENCOUNTER — Ambulatory Visit (INDEPENDENT_AMBULATORY_CARE_PROVIDER_SITE_OTHER): Admitting: Orthopedic Surgery

## 2023-09-23 DIAGNOSIS — S62631D Displaced fracture of distal phalanx of left index finger, subsequent encounter for fracture with routine healing: Secondary | ICD-10-CM

## 2023-09-23 NOTE — Progress Notes (Signed)
   Yvonne Pacheco - 63 y.o. female MRN [id number]  Date of birth: 11/25/1960  Office Visit Note: Visit Date: 09/23/2023 PCP: Katina Pfeiffer, PA-C Referred by: Katina Pfeiffer, PA-C  Subjective:  HPI: Yvonne Pacheco is a 63 y.o. female who presents today for follow up 7 weeks status post post irrigation debridement with pinning of open distal phalanx fracture index finger.  She is doing very well overall, pain is controlled, range of motion continues to improve.  Pertinent ROS were reviewed with the patient and found to be negative unless otherwise specified above in HPI.   Assessment & Plan: Visit Diagnoses:  1. Open displaced fracture of distal phalanx of left index finger with routine healing, subsequent encounter     Plan: She is doing well overall, pain is well-controlled.  She is continue to improve from a range of motion standpoint.  She can progress with activities as tolerated.  X-rays today show stable appearance of the index finger fracture with appropriate interval healing.  Follow-up in 1 month.  Follow-up: No follow-ups on file.   Meds & Orders: No orders of the defined types were placed in this encounter.   Orders Placed This Encounter  Procedures   XR Finger Index Left     Procedures: No procedures performed       Objective:   Vital Signs: There were no vitals taken for this visit.  Ortho Exam Left index finger with no tenderness, no erythema, mild swelling, range of motion MP 0-85, PIP 0-90, DIP 0-45  Imaging: XR Finger Index Left Result Date: 09/23/2023 X-rays demonstrate appropriate interval healing of previously known distal phalanx fracture of the index finger.  DIP remains well located in all planes    Ell Tiso Estela) Arlinda, M.D. [facility], Hand Surgery

## 2023-10-26 ENCOUNTER — Ambulatory Visit (INDEPENDENT_AMBULATORY_CARE_PROVIDER_SITE_OTHER): Admitting: Orthopedic Surgery

## 2023-10-26 ENCOUNTER — Other Ambulatory Visit (INDEPENDENT_AMBULATORY_CARE_PROVIDER_SITE_OTHER): Payer: Self-pay

## 2023-10-26 DIAGNOSIS — S62631D Displaced fracture of distal phalanx of left index finger, subsequent encounter for fracture with routine healing: Secondary | ICD-10-CM

## 2023-10-26 NOTE — Progress Notes (Unsigned)
   Yvonne Pacheco - 63 y.o. female MRN [id number]  Date of birth: 31-Aug-1960  Office Visit Note: Visit Date: 10/26/2023 PCP: Katina Pfeiffer, PA-C Referred by: Katina Pfeiffer, PA-C  Subjective:  HPI: Yvonne Pacheco is a 63 y.o. female who presents today for follow up 12 weeks status post irrigation debridement with pinning of open distal phalanx fracture index finger .  Pertinent ROS were reviewed with the patient and found to be negative unless otherwise specified above in HPI.   Assessment & Plan: Visit Diagnoses: No diagnosis found.  Plan: ***  Follow-up: No follow-ups on file.   Meds & Orders: No orders of the defined types were placed in this encounter.  No orders of the defined types were placed in this encounter.    Procedures: No procedures performed       Objective:   Vital Signs: There were no vitals taken for this visit.  Ortho Exam ***  Imaging: No results found.   Anshul Afton Alderton, M.D. [facility], Hand Surgery

## 2023-11-16 ENCOUNTER — Encounter: Payer: Self-pay | Admitting: Radiology
